# Patient Record
Sex: Female | Born: 1985 | Hispanic: Yes | Smoking: Never smoker
Health system: Southern US, Community
[De-identification: ages and names within clinical notes are randomized; demographics above are authoritative.]

## PROBLEM LIST (undated history)

## (undated) DIAGNOSIS — T7840XA Allergy, unspecified, initial encounter: Secondary | ICD-10-CM

## (undated) DIAGNOSIS — Z860100 Personal history of colon polyps, unspecified: Secondary | ICD-10-CM

## (undated) DIAGNOSIS — K219 Gastro-esophageal reflux disease without esophagitis: Secondary | ICD-10-CM

## (undated) DIAGNOSIS — K317 Polyp of stomach and duodenum: Secondary | ICD-10-CM

## (undated) DIAGNOSIS — K2970 Gastritis, unspecified, without bleeding: Secondary | ICD-10-CM

## (undated) DIAGNOSIS — G43909 Migraine, unspecified, not intractable, without status migrainosus: Secondary | ICD-10-CM

## (undated) DIAGNOSIS — E162 Hypoglycemia, unspecified: Secondary | ICD-10-CM

## (undated) DIAGNOSIS — K635 Polyp of colon: Secondary | ICD-10-CM

## (undated) HISTORY — DX: Polyp of stomach and duodenum: K31.7

## (undated) HISTORY — DX: Personal history of colon polyps, unspecified: Z86.0100

## (undated) HISTORY — PX: OTHER SURGICAL HISTORY: SHX169

## (undated) HISTORY — DX: Polyp of colon: K63.5

## (undated) HISTORY — DX: Hypoglycemia, unspecified: E16.2

## (undated) HISTORY — DX: Personal history of colonic polyps: Z86.010

## (undated) HISTORY — DX: Migraine, unspecified, not intractable, without status migrainosus: G43.909

## (undated) HISTORY — DX: Allergy, unspecified, initial encounter: T78.40XA

## (undated) HISTORY — PX: COLONOSCOPY: SHX174

## (undated) HISTORY — DX: Gastro-esophageal reflux disease without esophagitis: K21.9

---

## 2015-03-15 ENCOUNTER — Other Ambulatory Visit (HOSPITAL_COMMUNITY)
Admission: RE | Admit: 2015-03-15 | Discharge: 2015-03-15 | Disposition: A | Discharge status: Home / Self Care | Payer: Managed Care, Other (non HMO) | Source: Ambulatory Visit | Attending: Gynecology | Admitting: Gynecology

## 2015-03-15 ENCOUNTER — Encounter: Payer: Self-pay | Admitting: Women's Health

## 2015-03-15 ENCOUNTER — Ambulatory Visit (INDEPENDENT_AMBULATORY_CARE_PROVIDER_SITE_OTHER): Payer: Managed Care, Other (non HMO) | Admitting: Women's Health

## 2015-03-15 VITALS — BP 122/80 | Ht 65.0 in | Wt 126.0 lb

## 2015-03-15 DIAGNOSIS — B373 Candidiasis of vulva and vagina: Secondary | ICD-10-CM

## 2015-03-15 DIAGNOSIS — Z01419 Encounter for gynecological examination (general) (routine) without abnormal findings: Secondary | ICD-10-CM | POA: Insufficient documentation

## 2015-03-15 DIAGNOSIS — B3731 Acute candidiasis of vulva and vagina: Secondary | ICD-10-CM

## 2015-03-15 DIAGNOSIS — N898 Other specified noninflammatory disorders of vagina: Secondary | ICD-10-CM | POA: Diagnosis not present

## 2015-03-15 LAB — CBC WITH DIFFERENTIAL/PLATELET
BASOS PCT: 0 % (ref 0–1)
Basophils Absolute: 0 10*3/uL (ref 0.0–0.1)
Eosinophils Absolute: 0.2 10*3/uL (ref 0.0–0.7)
Eosinophils Relative: 2 % (ref 0–5)
HEMATOCRIT: 37.1 % (ref 36.0–46.0)
Hemoglobin: 12.7 g/dL (ref 12.0–15.0)
Lymphocytes Relative: 25 % (ref 12–46)
Lymphs Abs: 2 10*3/uL (ref 0.7–4.0)
MCH: 31.3 pg (ref 26.0–34.0)
MCHC: 34.2 g/dL (ref 30.0–36.0)
MCV: 91.4 fL (ref 78.0–100.0)
MONOS PCT: 6 % (ref 3–12)
MPV: 12.3 fL (ref 8.6–12.4)
Monocytes Absolute: 0.5 10*3/uL (ref 0.1–1.0)
NEUTROS PCT: 67 % (ref 43–77)
Neutro Abs: 5.4 10*3/uL (ref 1.7–7.7)
PLATELETS: 223 10*3/uL (ref 150–400)
RBC: 4.06 MIL/uL (ref 3.87–5.11)
RDW: 13 % (ref 11.5–15.5)
WBC: 8 10*3/uL (ref 4.0–10.5)

## 2015-03-15 LAB — GLUCOSE, RANDOM: GLUCOSE: 79 mg/dL (ref 70–99)

## 2015-03-15 LAB — WET PREP FOR TRICH, YEAST, CLUE
Clue Cells Wet Prep HPF POC: NONE SEEN
Trich, Wet Prep: NONE SEEN

## 2015-03-15 MED ORDER — FLUCONAZOLE 150 MG PO TABS: 150.0000 mg | ORAL_TABLET | Freq: Once | ORAL | Status: DC

## 2015-03-15 NOTE — Progress Notes (Signed)
Teresa Hurst September 13, 1985 [id number]    History:    Presents for annual exam.   Regular monthly cycle using no contraception pregnancy okay. Reports normal Pap history. Husband interpreted.  Past medical history, past surgical history, family history and social history were all reviewed and documented in the EPIC chart.  [place]. Has 1 son. Both parents diabetes.  ROS:  A ROS was performed and pertinent positives and negatives are included.  Exam:  Filed Vitals:   03/15/15 1426  BP: 122/80    General appearance:  Normal Thyroid:  Symmetrical, normal in size, without palpable masses or nodularity. Respiratory  Auscultation:  Clear without wheezing or rhonchi Cardiovascular  Auscultation:  Regular rate, without rubs, murmurs or gallops  Edema/varicosities:  Not grossly evident Abdominal  Soft,nontender, without masses, guarding or rebound.  Liver/spleen:  No organomegaly noted  Hernia:  None appreciated  Skin  Inspection:  Grossly normal   Breasts: Examined lying and sitting.     Right: Without masses, retractions, discharge or axillary adenopathy.     Left: Without masses, retractions, discharge or axillary adenopathy. Gentitourinary   Inguinal/mons:  Normal without inguinal adenopathy  External genitalia:  Normal  BUS/Urethra/Skene's glands:  Normal  Vagina:   Moderate amount of a white discharge wet prep positive for yeast  Cervix:  Normal  Uterus: normal in size, shape and contour.  Midline and mobile  Adnexa/parametria:     Rt: Without masses or tenderness.   Lt: Without masses or tenderness.  Anus and perineum: Normal  Digital rectal exam: Normal sphincter tone without palpated masses or tenderness  Assessment/Plan:  29 y.o.  MHF G2 P1 for annual exam with complaint of vaginal discharge   monthly 7 day cycle/no contraception/pregnancy okay  yeast vaginitis   Plan: Diflucan 150 by mouth 1 dose prescription, proper use given and reviewed yeast  prevention. SBE's, continue regular exercise, calcium rich diet, MVI daily encouraged. Return to office with missed cycle. Aware we no longer deliver. CBC, glucose, UA, Pap.   Harrington ChallengerYOUNG,NANCY J St Peters HospitalWHNP, 3:04 PM 03/15/2015

## 2015-03-15 NOTE — Patient Instructions (Signed)
Clarinda (Health Maintenance) Adoptar un estilo de vida saludable y recibir atencin preventiva pueden ser de suma utilidad para promover la salud y Musician. Hable con el mdico para saber cul es el esquema de exmenes peridicos adecuado para usted. Esta es una buena oportunidad para Teacher, adult education peridicamente al mdico sobre cmo prevenir enfermedades y [place] sano. Entre cada control mdico, hay muchas cosas que puede hacer por s solo. Los expertos han investigado mucho acerca de los cambios en el estilo de vida y las medidas preventivas que muy probablemente preserven su salud. Consulte al mdico para obtener ms informacin. EL PESO Y LA DIETA  Consuma una dieta saludable.  Incluya abundante cantidad de verduras, frutas, productos lcteos descremados y protenas magras.  No coma muchos alimentos con alto contenido de grasas slidas, azcares agregados o sal.  Realice actividad fsica con regularidad. Esta es una de las cosas ms importantes que puede hacer por su salud.  La [employer] de las personas adultas deben hacer actividad fsica durante por lo menos 140mnutos semanales. El ejercicio debe aumentar la frecuencia cardaca y hNature conservation officersudar (ejercicio de intensidad moderada).  Adems, casi todos los adultos deben hacer ejercicios de fortalecimiento al mToysRusveces por semana como complemento del ejercicio de iDante Mantenga un peso saludable.  El ndice de masa corporal ([facility] es una medida que puede usarse para identificar posibles problemas relacionados con el peso. Este ofrece un clculo estimativo de la gAir traffic controlleren funcin del peso y lAgricultural consultant El mdico puede determinar su IEncino Outpatient Surgery Center LLCy ayudarlo a aScience writery mTheatre managerun peso saludable.  Para las mujeres mayores de 20aos:  Un ISurgicare Surgical Associates Of Mahwah LLCmenor de 18,5 se considera bajo peso.  Un IBaptist Health Medical Center - North Little Rockentre 18,5 y 24,9 es normal.  Un ITerrell State Hospitalentre 25 y 29,9 es sobrepeso.  Un IMC de 30 o ms se considera  obesidad. Controlar los niveles de colesterol y lpidos en la sangre  Debe comenzar a hacerse anlisis de sangre para controlar los nAscutneyde lpidos y colesterol a partir de lSed[place] y repetir estos estudios cada 5aos.  Tal vez deba someterse a controles de los niveles de colesterol con ms frecuencia si:  Tiene los niveles de lpidos o colesterol elevados.  Es mayor de 50aos.  Tiene un riesgo alto de tener enfermedades cardacas. DETECCIN DE CNCER  Cncer de pulmn  Se recomienda realizar exmenes de deteccin de cncer de pulmn a las personas adultas que tienen entre 581y 80aos, y corren riesgo de tBest boycncer de pulmn debido a sus antecedentes de tabaquismo.  Se recomienda realizar una tomografa computarizada anual de baja dosis de los pulmones a las personas que:  Siguen fumando.  Hayan dejado de fumar en los ltimos 15aos.  Hayan fumado un paquete diario durante 30aos. El ndice ao-paquete equivale a fumar, en promedio, un paquete de cigarrillos diario durante 1ao.  Debe seguir realizndose estudios de deteccin anual hasta que hayan pasado 15aos desde que dej de fumar.  Estos estudios deben suspenderse si tiene un problema de salud que le impedira recibir tratamiento para eScience writerde pulmn. Cncer de mama  Ponga en prctica la "autoconciencia de las mamas". Esto significa reconocer la apariencia normal de las mamas y cSt. [place]  Adems implica realizarse autoexmenes peridicos de lJohnson & Johnson Informe al mdico si hay algn cambio, sin importar cun pequeo sea.  Si tiene entre 20 y 30aos, un mdico debe hacerle un examen clnico de las mamas cada 1 a 316aoscomo parte del  examen habitual de salud.  Si es mayor de 40aos, debe Electrical engineer un examen clnico de las Microsoft. Tambin debe considerar la posibilidad de realizarse una radiografa de las mamas ([place]) todos los [place].  Si tiene antecedentes familiares de cncer de  mama, hable con el mdico para saber si debe someterse a un estudio gentico.  Si tiene un riesgo alto de Animal nutritionist de mama, hable con el mdico para saber si debe hacerse una resonancia magntica y [employer].  Se recomienda una evaluacin del gen del cncer de mama (BRCA) a las mujeres que tengan familiares con tumores malignos relacionados con el BRCA. Los tumores malignos relacionados con elBRCA incluyen:  [employer].  Los [employer].  Los tumores malignos del peritoneo.  Los resultados de la evaluacin determinarn la necesidad de asesoramiento gentico y de [place] de BRCA1 y BRCA2. Cncer de cuello uterino Ya no se recomiendan los exmenes plvicos de rutina para la deteccin del cncer de cuello uterino en las mujeres que no estn embarazadas que son consideradas sujetos de bajo riesgo de Best boy cncer de los rganos de la pelvis (ovarios, tero y vagina) y que no tienen sntomas. Tal vez sea necesario realizar un examen plvico si tiene sntomas, incluidos aquellos que estn asociados con infecciones en la pelvis. Pregntele al mdico si un examen plvico de deteccin es adecuado para usted.   El Papanicolau es la prueba de deteccin del cncer de cuello uterino para las mujeres que podran Engineer, production.  Si le han realizado una histerectoma por un problema que no era cncer u otra enfermedad que podra causar cncer, ya no necesitar realizarse pruebas de Papanicolaou.  Si es mayor de 39aos y los resultados de las pruebas de Papanicolaou han sido normales durante los ltimos 10aos, ya no es necesario que se realice estos estudios.  Si ha recibido un tratamiento para el cncer de cuello uterino o para una enfermedad que podra causar cncer, necesitar realizarse una prueba de Papanicolaou y controles durante al menos 26 aos de concluido el [place].  Si ya no se realiza pruebas de Papanicolaou, debe evaluar sus factores de riesgo si estos  se modifican (por ejemplo, tiene un nuevo compaero sexual). Esta situacin puede influir en la necesidad de que se someta nuevamente a estudios de deteccin.  Algunas mujeres sufren problemas mdicos que aumentan la probabilidad de tener cncer de cuello uterino. En este caso, el mdico podr indicarle que se someta a exmenes de deteccin y pruebas de Papanicolaou con ms frecuencia.  La prueba del virus del Engineer, technical sales (VPH) es un estudio adicional que puede usarse para la deteccin del cncer de cuello uterino. Esta prueba busca la presencia del virus que puede causar cambios celulares en el cuello del tero. Las clulas que se recolectan durante la prueba de Papanicolaou pueden usarse para el VPH.  La prueba del VPH puede usarse para examinar a las Cendant Corporation de [id number]. Someterse a International aid/development worker del VPH puede prolongar el [employer] las pruebas de Papanicolaou normales de tres a Product manager.  Adems, se debe realizar la prueba del VPH para evaluar a las mujeres de cualquier edad cuyos resultados del Papanicolau no sean claros.  Despus de los 30aos, las mujeres deben realizarse pruebas del VPH con la misma frecuencia que las pruebas de Papanicolau. Cncer colorrectal  Es posible detectar este tipo de cncer y, a menudo, es posible prevenirlo.  Generalmente, los estudios de deteccin de  rutina del cncer colorrectal empiezan a hacerse a Proofreader de los 90aos y [employer] [phone].  El mdico puede recomendar que se los haga antes, si tiene factores de riesgo de cncer de colon.  Adems, el mdico puede recomendar que use un kit de prueba casera para hallar sangre oculta en la materia fecal.  Es posible que se use una pequea cmara en el extremo de un tubo para examinar directamente el colon (sigmoidoscopa o colonoscopa), con el fin de Hydrographic surveyor las formas ms incipientes de Surveyor, minerals.  Generalmente, los estudios de deteccin de rutina se Insurance underwriter  a Proofreader de [place].  El examen directo del colon debe repetirse cada 5 a 10aos hasta cumplir 75aos. Sin embargo, tal vez deba someterse a la prueba de deteccin con ms frecuencia si se encuentran formas incipientes de plipos precancerosos o pequeos tumores. Cncer de piel  Revsese la piel peridicamente desde los dedos de los pies hasta la cabeza.  Informe al mdico si aparecen nuevos lunares o si nota cambios en los que ya tiene, especialmente en la forma o el color.  Tambin notifquele si tiene un lunar cuyo tamao es ms grande que el de la goma de un lpiz.  Use siempre pantalla solar. Aplique pantalla solar tantas veces como pueda a lo largo del da.  Protjase usando mangas y pantalones largos, un sombrero de ala ancha y gafas para el sol todo [place], siempre que est al [place]. ENFERMEDADES CARDACAS, DIABETES E HIPERTENSIN ARTERIAL   Debe controlarse la presin arterial al menos cada 1 o 2aos. La hipertensin arterial causa enfermedades cardacas y [place] el riesgo de ictus.  Si tiene entre 64 y 79aos, consulte al mdico si debe tomar aspirina para prevenir ictus.  Hgase anlisis peridicos para la diabetes, que incluyen la toma de [place] de sangre para Freight forwarder nivel de azcar en la sangre mientras est en ayunas.  Si su peso es normal y tiene un riesgo bajo de tener diabetes, hgase este anlisis una vez cada tres aos, despus de los 45aos.  Si tiene sobrepeso y un riesgo alto de sufrir diabetes, considere la posibilidad de [employer] a una edad ms temprana o con ms frecuencia. PREVENCIN DE INFECCIONES  Hepatitis B  Si tiene un riesgo ms alto de tener hepatitisB, debe hacerse anlisis de deteccin de [place] virus. Se considera que tiene un alto riesgo de hepatitis B si:  Naci en un pas donde la hepatitisB es frecuente. Pregntele al mdico qu pases son considerados de Public affairs consultant.  Sus padres nacieron en un pas de alto  riesgo, y usted no recibi la vacuna contra la hepatitis B.  [place].  [place] agujas para inyectarse drogas.  Convive con una persona que tiene hepatitisB.  Tuvo relaciones sexuales con una persona que tiene hepatitisB.  Recibe tratamiento de hemodilisis. Toma ciertos medicamentos para cuadros clnicos tales Civil engineer, contracting, trasplante de Vaginitis monilisica (Monilial Vaginitis) La vaginitis es una inflamacin (irritacin, hinchazn) de la vagina y la vulva. Esta no es una enfermedad de transmisin sexual.  CAUSAS Este tipo de vaginitis lo causa un hongo (candida) que normalmente se encuentra en la vagina. El hongo candida se ha desarrollado hasta el punto de ocasionar problemas en el equilibrio qumico. SNTOMAS  Secrecin vaginal espesa y blanca.  Hinchazn, picazn, enrojecimiento e inflamacin de la vagina y en algunos casos de los labios vaginales (vulva).  Ardor o dolor al [employer].  Dolor en [place]. DIAGNSTICO  Los factores que favorecen la vaginitis moniliasica son:  Kyla Balzarine de virginidad y postmenopusicas.  Embarazo.  Infecciones.  Sentir cansancio, estar enferma o estresada, especialmente si ya ha sufrido este problema en el pasado.  Diabetes Buen control ayudar a disminur la probabilidad.  Pldoras anticonceptivas  Ropa interior [place].  El uso de espumas de bao, aerosoles femeninos duchas vaginales o tampones con desodorante.  Algunos antibiticos (medicamentos que destruyen grmenes).  Si contrae alguna enfermedad puede sufrir recurrencias espordicas. [place] profesional que lo asiste prescribir medicamentos.  Hay diferentes tipos de cremas y supositorios vaginales que tratan especficamente la vaginitis monilisica. Para infecciones por hongos recurrentes, utilice un supositorio o crema en la vagina dos veces por semana, o segn se le indique.  Tambin podrn utilizarse cremas con corticoides o anti monilisicas  para la picazn o la irritacin de la vulva. Consulte con el profesional que la asiste.  Si la crema no da resultado, podr aplicarse en la vagina una solucin con azul de metileno.  El consumo de yogur puede prevenir este tipo de vaginitis. INSTRUCCIONES Manata todos los medicamentos tal como se le indic.  No mantenga relaciones sexuales hasta que el tratamiento se haya completado, o segn las indicaciones del profesional que la asiste.  Tome baos de asiento tibios.  No se aplique duchas vaginales.  No utilice tampones, especialmente los perfumados.  Use ropa interior de algodn  [employer]user[employer]ch pantalones ajustados y las medias tipo panty.  Comunique a sus compaeros sexuales que sufre una infeccin por hongos. Ellos deben concurrir para un control mdico si tienen sntomas como una urticaria leve o picazn.  Sus compaeros sexuales deben tratarse tambin si la infeccin es difcil de Radiographer, therapeutic.  Practique el sexo seguro - use condones  Algunos medicamentos vaginales ocasionan fallas en los condones de ltex. Los medicamentos vaginales que pueden daar los condones son:  Building services engineer cleocina  Butoconazole (Femstat)  Terconazole (Terazol) supositorios vaginales  Miconazole (Monistat) (es un medicamento de venta libre) SOLICITE ATENCIN MDICA SI:  Waldron Session tiene una temperatura oral de ms de 38,9 C (102 F).  Si la infeccin empeora luego de 2 das de tratamiento.  Si la infeccin no mejora luego de 3 das de tratamiento.  Aparecen ampollas en o alrededor de la vagina.  Si aparece una hemorragia vaginal y no es el momento del perodo.  Siente dolor al [employer].  Presenta problemas intestinales.  Tiene dolor durante las [employer]. Document Released: 05/23/2005 Document Revised: 11/05/2011 [facility] Patient Information 2015 [place]. This information is not intended to replace advice given to you by your health care provider. Make  sure you discuss any questions you have with your health care provider.

## 2015-03-15 NOTE — Addendum Note (Signed)
Addended by: Aura CampsWEBB, Zeke Aker L on: 03/15/2015 03:15 PM   Modules accepted: Orders

## 2015-03-16 LAB — URINALYSIS W MICROSCOPIC + REFLEX CULTURE
Bilirubin Urine: NEGATIVE
CASTS: NONE SEEN
Crystals: NONE SEEN
Glucose, UA: NEGATIVE mg/dL
Hgb urine dipstick: NEGATIVE
KETONES UR: NEGATIVE mg/dL
Leukocytes, UA: NEGATIVE
Nitrite: NEGATIVE
PH: 6.5 (ref 5.0–8.0)
Protein, ur: NEGATIVE mg/dL
Specific Gravity, Urine: 1.027 (ref 1.005–1.030)
Urobilinogen, UA: 0.2 mg/dL (ref 0.0–1.0)

## 2015-03-17 LAB — CYTOLOGY - PAP

## 2015-03-17 LAB — URINE CULTURE
Colony Count: NO GROWTH
Organism ID, Bacteria: NO GROWTH

## 2015-06-04 ENCOUNTER — Ambulatory Visit (INDEPENDENT_AMBULATORY_CARE_PROVIDER_SITE_OTHER): Payer: Managed Care, Other (non HMO) | Admitting: Family Medicine

## 2015-06-04 VITALS — BP 106/56 | HR 81 | Temp 98.6°F | Resp 16 | Ht 64.5 in | Wt 129.0 lb

## 2015-06-04 DIAGNOSIS — R1084 Generalized abdominal pain: Secondary | ICD-10-CM | POA: Diagnosis not present

## 2015-06-04 DIAGNOSIS — J301 Allergic rhinitis due to pollen: Secondary | ICD-10-CM

## 2015-06-04 DIAGNOSIS — Z86010 Personal history of colonic polyps: Secondary | ICD-10-CM

## 2015-06-04 DIAGNOSIS — K625 Hemorrhage of anus and rectum: Secondary | ICD-10-CM | POA: Diagnosis not present

## 2015-06-04 DIAGNOSIS — K2970 Gastritis, unspecified, without bleeding: Secondary | ICD-10-CM | POA: Diagnosis not present

## 2015-06-04 DIAGNOSIS — Z23 Encounter for immunization: Secondary | ICD-10-CM | POA: Diagnosis not present

## 2015-06-04 LAB — COMPREHENSIVE METABOLIC PANEL
ALK PHOS: 44 U/L (ref 33–115)
ALT: 16 U/L (ref 6–29)
AST: 16 U/L (ref 10–30)
Albumin: 4.3 g/dL (ref 3.6–5.1)
BILIRUBIN TOTAL: 0.5 mg/dL (ref 0.2–1.2)
BUN: 10 mg/dL (ref 7–25)
CO2: 24 mmol/L (ref 20–31)
CREATININE: 0.66 mg/dL (ref 0.50–1.10)
Calcium: 9.7 mg/dL (ref 8.6–10.2)
Chloride: 103 mmol/L (ref 98–110)
GLUCOSE: 92 mg/dL (ref 65–99)
POTASSIUM: 4 mmol/L (ref 3.5–5.3)
SODIUM: 136 mmol/L (ref 135–146)
TOTAL PROTEIN: 7.1 g/dL (ref 6.1–8.1)

## 2015-06-04 LAB — POCT URINALYSIS DIP (MANUAL ENTRY)
BILIRUBIN UA: NEGATIVE
BILIRUBIN UA: NEGATIVE
Glucose, UA: NEGATIVE
Leukocytes, UA: NEGATIVE
Nitrite, UA: NEGATIVE
PH UA: 7.5
PROTEIN UA: NEGATIVE
RBC UA: NEGATIVE
SPEC GRAV UA: 1.015
Urobilinogen, UA: 0.2

## 2015-06-04 LAB — POCT SEDIMENTATION RATE: POCT SED RATE: 9 mm/h (ref 0–22)

## 2015-06-04 LAB — IFOBT (OCCULT BLOOD): IMMUNOLOGICAL FECAL OCCULT BLOOD TEST: NEGATIVE

## 2015-06-04 LAB — POCT CBC
Granulocyte percent: 64.2 %G (ref 37–80)
HEMATOCRIT: 39.5 % (ref 37.7–47.9)
Hemoglobin: 12.1 g/dL — AB (ref 12.2–16.2)
LYMPH, POC: 2.2 (ref 0.6–3.4)
MCH, POC: 27.9 pg (ref 27–31.2)
MCHC: 30.8 g/dL — AB (ref 31.8–35.4)
MCV: 90.7 fL (ref 80–97)
MID (CBC): 0.6 (ref 0–0.9)
MPV: 10 fL (ref 0–99.8)
POC Granulocyte: 4.9 (ref 2–6.9)
POC LYMPH %: 28.3 % (ref 10–50)
POC MID %: 7.5 %M (ref 0–12)
Platelet Count, POC: 216 10*3/uL (ref 142–424)
RBC: 4.35 M/uL (ref 4.04–5.48)
RDW, POC: 12.3 %
WBC: 7.6 10*3/uL (ref 4.6–10.2)

## 2015-06-04 LAB — POCT URINE PREGNANCY: PREG TEST UR: NEGATIVE

## 2015-06-04 NOTE — Progress Notes (Addendum)
Subjective:  This chart was scribed for Nilda Simmer, MD by Andrew Au, ED Scribe. This patient was seen in room 9 and the patient's care was started at 1:13 PM   Patient ID: Teresa Hurst, female    DOB: [birth year], 29 y.o.   MRN: [id number]  HPI   Chief Complaint  Patient presents with  . Rectal Bleeding    Onset 2 days ago  . Abdominal Pain    Onset 1 month off and on   HPI Comments: Teresa Hurst is a 29 y.o. female who presents to the [facility] complaining of intermittent rectal bleeding that began 2 months ago. Pt has noticed a small amount of pinkish/red blood with mucous occasionally with wiping after BM's. She has a BM everyday. She's also had burning lower abdominal pain and cramping after having BM's.  She has hx of polyps in colon that she had surgically [place] in [place] 10 years ago. She is supposed to have colonoscopy annually but states its been 3 years since having one, due to insurance reasons. She's had a total of 3 colonoscopies in the past.  Pt has lived in [place] for 1 year. She has family hx of colon cancer including her cousin and uncle. She has hx of gastritis and takes zantac once a day. She denies hx of Crohn's or ulcerative colitis in family or personally. She denies constipation, diarrhea dizziness, weight loss and appetite change.  She also c/o a HA for 1 month. She reports associated visual disturbance and swollen lymph nodes.  P has hx of allergies and has sinus congestion with drainage recently.  Family hx- Pt mother is 79 with hx of HTN and DM. Her father is DM. She has 1 sister and 2 brothers with hx of allergies.   Social Hx- Pt lives with her boyfriend and her son who is almost 2. She is originally from [place] She denies chance of pregnancy. She is not a smoker, drinks alcohol on the weekend occasionally, but no elicit drug use. Pt does not work. Occasionally exercises.  She is allergic to Toradol.   Past  Medical History  Diagnosis Date  . GERD (gastroesophageal reflux disease)   . History of colon polyps   . Allergy    Allergies  Allergen Reactions  . Toradol [Ketorolac Tromethamine]    Prior to Admission medications   Medication Sig Start Date End Date Taking? Authorizing Provider  ibuprofen (ADVIL,MOTRIN) 200 MG tablet Take 200 mg by mouth every 6 (six) hours as needed.   Yes Historical Provider, MD  ranitidine (ZANTAC) 150 MG tablet Take 150 mg by mouth once.   Yes Historical Provider, MD   Social History   Social History  . Marital Status: Significant Other    Spouse Name: N/A  . Number of Children: N/A  . Years of Education: N/A   Occupational History  . Not on file.   Social History Main Topics  . Smoking status: Never Smoker   . Smokeless tobacco: Not on file  . Alcohol Use: 0.6 oz/week    1 Standard drinks or equivalent per week  . Drug Use: No  . Sexual Activity: Yes     Comment: INTERCOURSE AGE 60, SEXUAL PARTNRES LESS THAN 5   Other Topics Concern  . Not on file   Social History Narrative   Marital status:  Single; dating seriously.  From [place] moved to [place] in 2015.      Children:  87 son (16 years old)      Lives: with boyfriend, 1 son.      Employment: unemployed.      Tobacco; none      Alcohol: weekends      Drugs: none      Exercise: yes   Family History  Problem Relation Age of Onset  . Hypertension Mother   . Diabetes Mother   . Diabetes Father     Review of Systems  Constitutional: Negative for fever, chills, diaphoresis, activity change, appetite change, fatigue and unexpected weight change.  HENT: Positive for congestion, rhinorrhea and sinus pressure. Negative for ear pain, sore throat and trouble swallowing.   Eyes: Positive for visual disturbance.  Respiratory: Negative for cough and shortness of breath.   Cardiovascular: Negative for chest pain.  Gastrointestinal: Positive for abdominal pain and anal bleeding. Negative for  nausea, vomiting, diarrhea, constipation, blood in stool and rectal pain.  Genitourinary: Negative for dysuria, urgency and hematuria.  Neurological: Positive for headaches. Negative for dizziness, tremors, seizures, syncope, facial asymmetry, speech difficulty, weakness, light-headedness and numbness.  Hematological: Positive for adenopathy.  Psychiatric/Behavioral: Negative for dysphoric mood.       Objective:   Physical Exam  Constitutional: She is oriented to person, place, and time. She appears well-developed and well-nourished. No distress.  HENT:  Head: Normocephalic and atraumatic.  Right Ear: External ear normal.  Left Ear: External ear normal.  Nose: Nose normal.  Mouth/Throat: Oropharynx is clear and moist.  Eyes: Conjunctivae and EOM are normal. Pupils are equal, round, and reactive to light.  Neck: Normal range of motion. Neck supple. No thyromegaly present.  Cardiovascular: Normal rate, regular rhythm and normal heart sounds.   Pulmonary/Chest: Effort normal and breath sounds normal. No respiratory distress. She has no wheezes. She has no rales.  Abdominal: Soft. Bowel sounds are normal. She exhibits no distension and no mass. There is tenderness in the right lower quadrant and left lower quadrant. There is no rebound and no guarding.  Genitourinary: Rectum normal. Rectal exam shows no external hemorrhoid, no fissure, no mass and no tenderness.  Musculoskeletal: Normal range of motion.  Lymphadenopathy:    She has no cervical adenopathy.  Neurological: She is alert and oriented to person, place, and time.  Skin: Skin is warm and dry. She is not diaphoretic.  Psychiatric: She has a normal mood and affect. Her behavior is normal.  Nursing note and vitals reviewed.   Filed Vitals:   06/04/15 1306  BP: 106/56  Pulse: 81  Temp: 98.6 F (37 C)  TempSrc: Oral  Resp: 16  Height: 5' 4.5" (1.638 m)  Weight: 129 lb (58.514 kg)  SpO2: 97%    Results for orders placed or  performed in visit on 06/04/15  IFOBT POC (occult bld, rslt in office)  Result Value Ref Range   IFOBT Negative   POCT CBC  Result Value Ref Range   WBC 7.6 4.6 - 10.2 K/uL   Lymph, poc 2.2 0.6 - 3.4   POC LYMPH PERCENT 28.3 10 - 50 %L   MID (cbc) 0.6 0 - 0.9   POC MID % 7.5 0 - 12 %M   POC Granulocyte 4.9 2 - 6.9   Granulocyte percent 64.2 37 - 80 %G   RBC 4.35 4.04 - 5.48 M/uL   Hemoglobin 12.1 (A) 12.2 - 16.2 g/dL   HCT, POC 78.2 95.6 - 47.9 %   MCV 90.7 80 - 97 fL   MCH, POC  27.9 27 - 31.2 pg   MCHC 30.8 (A) 31.8 - 35.4 g/dL   RDW, POC 21.3 %   Platelet Count, POC 216 142 - 424 K/uL   MPV 10.0 0 - 99.8 fL  POCT SEDIMENTATION RATE  Result Value Ref Range   POCT SED RATE 9 0 - 22 mm/hr  POCT urine pregnancy  Result Value Ref Range   Preg Test, Ur Negative Negative  POCT urinalysis dipstick  Result Value Ref Range   Color, UA yellow yellow   Clarity, UA clear clear   Glucose, UA negative negative   Bilirubin, UA negative negative   Ketones, POC UA negative negative   Spec Grav, UA 1.015    Blood, UA negative negative   pH, UA 7.5    Protein Ur, POC negative negative   Urobilinogen, UA 0.2    Nitrite, UA Negative Negative   Leukocytes, UA Negative Negative    Assessment & Plan:   1. Rectal bleeding   2. Generalized abdominal pain   3. History of colonic polyps   4. Gastritis   5. Allergic rhinitis due to pollen   6. Need for prophylactic vaccination and inoculation against influenza     1. Rectal bleeding:  New.  History of colon polyps and s/p three colonoscopies in the past; refer to GI. 2.  Generalized abdominal pain:  New.  Associated with rectal bleeding and history of colon polyps. Refer to GI.  3.  History of colon polyps: with previous three colonoscopies in the past; question of familial polyposis; family history of colon cancer; refer to GI. 4.  Gastritis: stable; continue Zantac daily.  Recommend avoiding NSAIDs. 5.  Allergic Rhinitis: uncontrolled;  recommend Zyrtec/Claritin daily and Flonase daily. 6.  S/p flu vaccine.   Orders Placed This Encounter  Procedures  . Flu Vaccine QUAD 36+ mos IM  . Comprehensive metabolic panel  . Ambulatory referral to Gastroenterology    Referral Priority:  Routine    Referral Type:  Consultation    Referral Reason:  Specialty Services Required    Referred to Provider:  Iva Boop, MD    Number of Visits Requested:  1  . IFOBT POC (occult bld, rslt in office)  . POCT CBC  . POCT SEDIMENTATION RATE  . POCT urine pregnancy  . POCT urinalysis dipstick    Meds ordered this encounter  Medications  . ranitidine (ZANTAC) 150 MG tablet    Sig: Take 150 mg by mouth once.  Marland Kitchen ibuprofen (ADVIL,MOTRIN) 200 MG tablet    Sig: Take 200 mg by mouth every 6 (six) hours as needed.  . loratadine (CLARITIN) 10 MG tablet    Sig: Take 1 tablet (10 mg total) by mouth daily.    Dispense:  30 tablet    Refill:  11  . fluticasone (FLONASE) 50 MCG/ACT nasal spray    Sig: Place 2 sprays into both nostrils daily.    Dispense:  16 g    Refill:  11    I personally performed the services described in this documentation, which was scribed in my presence. The recorded information has been reviewed and considered.  Kristi Paulita Fujita, M.D. [facility] [address] [place], [place]  [address] [phone] phone [phone] fax

## 2015-06-05 ENCOUNTER — Encounter: Payer: Self-pay | Admitting: Family Medicine

## 2015-06-05 MED ORDER — FLUTICASONE PROPIONATE 50 MCG/ACT NA SUSP: 2.0000 | Freq: Every day | NASAL | Status: DC

## 2015-06-05 MED ORDER — LORATADINE 10 MG PO TABS: 10.0000 mg | ORAL_TABLET | Freq: Every day | ORAL | Status: DC

## 2015-09-09 ENCOUNTER — Ambulatory Visit: Payer: Managed Care, Other (non HMO) | Admitting: Family Medicine

## 2015-09-19 ENCOUNTER — Ambulatory Visit (INDEPENDENT_AMBULATORY_CARE_PROVIDER_SITE_OTHER): Payer: Managed Care, Other (non HMO) | Admitting: Family Medicine

## 2015-09-19 ENCOUNTER — Encounter: Payer: Self-pay | Admitting: Family Medicine

## 2015-09-19 VITALS — BP 98/61 | HR 72 | Temp 97.9°F | Resp 16 | Ht 64.25 in | Wt 130.6 lb

## 2015-09-19 DIAGNOSIS — Z8719 Personal history of other diseases of the digestive system: Secondary | ICD-10-CM | POA: Diagnosis not present

## 2015-09-19 DIAGNOSIS — K625 Hemorrhage of anus and rectum: Secondary | ICD-10-CM

## 2015-09-19 DIAGNOSIS — R1013 Epigastric pain: Secondary | ICD-10-CM | POA: Diagnosis not present

## 2015-09-19 DIAGNOSIS — Z23 Encounter for immunization: Secondary | ICD-10-CM

## 2015-09-19 LAB — POCT URINE PREGNANCY: Preg Test, Ur: NEGATIVE

## 2015-09-19 LAB — COMPREHENSIVE METABOLIC PANEL
ALT: 11 U/L (ref 6–29)
AST: 14 U/L (ref 10–30)
Albumin: 4.5 g/dL (ref 3.6–5.1)
Alkaline Phosphatase: 45 U/L (ref 33–115)
BUN: 14 mg/dL (ref 7–25)
CHLORIDE: 105 mmol/L (ref 98–110)
CO2: 23 mmol/L (ref 20–31)
CREATININE: 0.85 mg/dL (ref 0.50–1.10)
Calcium: 9.4 mg/dL (ref 8.6–10.2)
GLUCOSE: 95 mg/dL (ref 65–99)
POTASSIUM: 3.8 mmol/L (ref 3.5–5.3)
SODIUM: 138 mmol/L (ref 135–146)
Total Bilirubin: 0.5 mg/dL (ref 0.2–1.2)
Total Protein: 7.1 g/dL (ref 6.1–8.1)

## 2015-09-19 LAB — HEMOCCULT GUIAC POC 1CARD (OFFICE): Fecal Occult Blood, POC: NEGATIVE

## 2015-09-19 LAB — POCT CBC
GRANULOCYTE PERCENT: 65.8 % (ref 37–80)
HEMATOCRIT: 37.5 % — AB (ref 37.7–47.9)
HEMOGLOBIN: 12.9 g/dL (ref 12.2–16.2)
LYMPH, POC: 2.2 (ref 0.6–3.4)
MCH: 31.1 pg (ref 27–31.2)
MCHC: 34.5 g/dL (ref 31.8–35.4)
MCV: 90.2 fL (ref 80–97)
MID (cbc): 0.4 (ref 0–0.9)
MPV: 9.9 fL (ref 0–99.8)
POC GRANULOCYTE: 5.1 (ref 2–6.9)
POC LYMPH PERCENT: 28.6 %L (ref 10–50)
POC MID %: 5.6 %M (ref 0–12)
Platelet Count, POC: 203 10*3/uL (ref 142–424)
RBC: 4.16 M/uL (ref 4.04–5.48)
RDW, POC: 11.8 %
WBC: 7.7 10*3/uL (ref 4.6–10.2)

## 2015-09-19 LAB — POCT URINALYSIS DIP (MANUAL ENTRY)
BILIRUBIN UA: NEGATIVE
Glucose, UA: NEGATIVE
Leukocytes, UA: NEGATIVE
Nitrite, UA: NEGATIVE
PH UA: 7.5
Protein Ur, POC: NEGATIVE
RBC UA: NEGATIVE
Spec Grav, UA: 1.02
Urobilinogen, UA: 0.2

## 2015-09-19 MED ORDER — HYDROCORTISONE ACETATE 25 MG RE SUPP: 25.0000 mg | Freq: Two times a day (BID) | RECTAL | Status: DC

## 2015-09-19 MED ORDER — LANSOPRAZOLE 30 MG PO CPDR: 30.0000 mg | DELAYED_RELEASE_CAPSULE | Freq: Every day | ORAL | Status: DC

## 2015-09-19 NOTE — Progress Notes (Signed)
Subjective:    Patient ID: Teresa Hurst, female    DOB: February 07, 1986, 30 y.o.   MRN: [id number]  09/19/2015  Referral; Abdominal Pain; and Nausea   HPI This 30 y.o. female presents for evaluation of "problems with my stomach".  Started having rectal bleeding and burning in stomach and malaise and fatigue.  On menses, symptoms get worse.  With last b.m., with bowel movement cleaned self and blooody.  Afterwards there was a burning sensation and had to lie down due to fatigue.  Onset with acute episode, this week and last week.  Ended menses three days ago. Small rectal bleeding yesterday but not bright red; yesterday had cramping.   Referred to GI in October 2016; cancelled appointment due to travel. Then, husband called to reschedule; pt needs rereferral.  Only a few episodes of rectal bleeding; frequently occurs with menses and must have b.m.  One C-section.  No constipation; no straining.  With eating fatty foods or spicy foods, gets pain in middle of abdomen.  Cousin with colon cancer; another cousin with colitis.  Chronic gastritis.  During childhood, removed gastric polyp.  Also found a bacteria and had to take medication. No recent EGD recently.    Review of Systems  Constitutional: Negative for fever, chills, diaphoresis and fatigue.  Eyes: Negative for visual disturbance.  Respiratory: Negative for cough and shortness of breath.   Cardiovascular: Negative for chest pain, palpitations and leg swelling.  Gastrointestinal: Negative for nausea, vomiting, abdominal pain, diarrhea and constipation.  Endocrine: Negative for cold intolerance, heat intolerance, polydipsia, polyphagia and polyuria.  Neurological: Negative for dizziness, tremors, seizures, syncope, facial asymmetry, speech difficulty, weakness, light-headedness, numbness and headaches.    Past Medical History  Diagnosis Date  . GERD (gastroesophageal reflux disease)   . History of colon polyps   . Allergy    Past  Surgical History  Procedure Laterality Date  . Colon polyps  removed   Allergies  Allergen Reactions  . Toradol [Ketorolac Tromethamine]    Current Outpatient Prescriptions  Medication Sig Dispense Refill  . ibuprofen (ADVIL,MOTRIN) 200 MG tablet Take 200 mg by mouth every 6 (six) hours as needed.    . ranitidine (ZANTAC) 150 MG tablet Take 150 mg by mouth once.    . fluticasone (FLONASE) 50 MCG/ACT nasal spray Place 2 sprays into both nostrils daily. (Patient not taking: Reported on 09/19/2015) 16 g 11  . loratadine (CLARITIN) 10 MG tablet Take 1 tablet (10 mg total) by mouth daily. (Patient not taking: Reported on 09/19/2015) 30 tablet 11   No current facility-administered medications for this visit.   Social History   Social History  . Marital Status: Significant Other    Spouse Name: N/A  . Number of Children: N/A  . Years of Education: N/A   Occupational History  . Not on file.   Social History Main Topics  . Smoking status: Never Smoker   . Smokeless tobacco: Not on file  . Alcohol Use: 0.6 oz/week    1 Standard drinks or equivalent per week  . Drug Use: No  . Sexual Activity: Yes     Comment: INTERCOURSE AGE 21, SEXUAL PARTNRES LESS THAN 5   Other Topics Concern  . Not on file   Social History Narrative   Marital status:  Single; dating seriously.  From [place] moved to [place] in 2015.      Children:  88 son (74 years old)      Lives: with boyfriend, 1 son.  Employment: unemployed.      Tobacco; none      Alcohol: weekends      Drugs: none      Exercise: yes   Family History  Problem Relation Age of Onset  . Hypertension Mother   . Diabetes Mother   . Diabetes Father        Objective:    BP 98/61 mmHg  Pulse 72  Temp(Src) 97.9 F (36.6 C) (Oral)  Resp 16  Ht 5' 4.25" (1.632 m)  Wt 130 lb 9.6 oz (59.24 kg)  BMI 22.24 kg/m2  SpO2 99%  LMP 09/11/2015 Physical Exam  Constitutional: She is oriented to person, place, and time. She appears  well-developed and well-nourished. No distress.  HENT:  Head: Normocephalic and atraumatic.  Right Ear: External ear normal.  Left Ear: External ear normal.  Nose: Nose normal.  Mouth/Throat: Oropharynx is clear and moist.  Eyes: Conjunctivae and EOM are normal. Pupils are equal, round, and reactive to light.  Neck: Normal range of motion. Neck supple. Carotid bruit is not present. No thyromegaly present.  Cardiovascular: Normal rate, regular rhythm, normal heart sounds and intact distal pulses.  Exam reveals no gallop and no friction rub.   No murmur heard. Pulmonary/Chest: Effort normal and breath sounds normal. She has no wheezes. She has no rales.  Abdominal: Soft. Bowel sounds are normal. She exhibits no distension and no mass. There is no tenderness. There is no rebound and no guarding.  Lymphadenopathy:    She has no cervical adenopathy.  Neurological: She is alert and oriented to person, place, and time. No cranial nerve deficit.  Skin: Skin is warm and dry. No rash noted. She is not diaphoretic. No erythema. No pallor.  Psychiatric: She has a normal mood and affect. Her behavior is normal.   Results for orders placed or performed in visit on 06/04/15  Comprehensive metabolic panel  Result Value Ref Range   Sodium 136 135 - 146 mmol/L   Potassium 4.0 3.5 - 5.3 mmol/L   Chloride 103 98 - 110 mmol/L   CO2 24 20 - 31 mmol/L   Glucose, Bld 92 65 - 99 mg/dL   BUN 10 7 - 25 mg/dL   Creat 1.61 0.96 - 0.45 mg/dL   Total Bilirubin 0.5 0.2 - 1.2 mg/dL   Alkaline Phosphatase 44 33 - 115 U/L   AST 16 10 - 30 U/L   ALT 16 6 - 29 U/L   Total Protein 7.1 6.1 - 8.1 g/dL   Albumin 4.3 3.6 - 5.1 g/dL   Calcium 9.7 8.6 - 40.9 mg/dL  IFOBT POC (occult bld, rslt in office)  Result Value Ref Range   IFOBT Negative   POCT CBC  Result Value Ref Range   WBC 7.6 4.6 - 10.2 K/uL   Lymph, poc 2.2 0.6 - 3.4   POC LYMPH PERCENT 28.3 10 - 50 %L   MID (cbc) 0.6 0 - 0.9   POC MID % 7.5 0 - 12 %M     POC Granulocyte 4.9 2 - 6.9   Granulocyte percent 64.2 37 - 80 %G   RBC 4.35 4.04 - 5.48 M/uL   Hemoglobin 12.1 (A) 12.2 - 16.2 g/dL   HCT, POC 81.1 91.4 - 47.9 %   MCV 90.7 80 - 97 fL   MCH, POC 27.9 27 - 31.2 pg   MCHC 30.8 (A) 31.8 - 35.4 g/dL   RDW, POC 78.2 %   Platelet Count, POC 216  142 - 424 K/uL   MPV 10.0 0 - 99.8 fL  POCT SEDIMENTATION RATE  Result Value Ref Range   POCT SED RATE 9 0 - 22 mm/hr  POCT urine pregnancy  Result Value Ref Range   Preg Test, Ur Negative Negative  POCT urinalysis dipstick  Result Value Ref Range   Color, UA yellow yellow   Clarity, UA clear clear   Glucose, UA negative negative   Bilirubin, UA negative negative   Ketones, POC UA negative negative   Spec Grav, UA 1.015    Blood, UA negative negative   pH, UA 7.5    Protein Ur, POC negative negative   Urobilinogen, UA 0.2    Nitrite, UA Negative Negative   Leukocytes, UA Negative Negative       Assessment & Plan:  No diagnosis found.  No orders of the defined types were placed in this encounter.   No orders of the defined types were placed in this encounter.    No Follow-up on file.    Kourtney Terriquez Paulita Fujita, M.D. [facility] [address] [place], [place]  [address] [phone] phone [phone] fax

## 2015-09-19 NOTE — Patient Instructions (Signed)
Gastritis - Adultos °(Gastritis, Adult) ° La gastrittis es la irritación (inflamación) de la membrana interna del estómago. Puede ser una enfermedad de inicio súbito (aguda) o de largo plazo (crónica). Si la gastritis no se trata, puede causar sangrado y úlceras. °CAUSAS  °La gastritis se produce cuando la membrana que tapiza interiormente al estómago se debilita o se daña. Los jugos digestivos del estómago inflaman el revestimiento del estómago debilitado. El revestimiento del estómago puede debilitarse o dañarse por una infección viral o bacteriana. La infección bacteriana más común es la infección por Helicobacter pylori. También puede ser el resultado del consumo excesivo de alcohol, por el uso de ciertos medicamentos o porque hay demasiado ácido en el estómago.  °SÍNTOMAS  °En algunos casos no hay síntomas. Si se presentan síntomas, éstos pueden ser:  °· Dolor o sensación de ardor en la parte superior del abdomen. °· Náuseas. °· Vómitos. °· Sensación molesta de distensión después de comer. °DIAGNÓSTICO  °El médico puede diagnosticar gastritis según los síntomas y el examen físico. Para determinar la causa de la gastritis, el médico podrá:  °· Pedir análisis de sangre o de materia fecal para diagnosticar la presencia de la bacteria H pylori. °· Gastroscopía. Un tubo delgado y flexible (endoscopio) se pasa por el esófago hasta llegar al estómago. El endoscopio tiene una luz y una cámara en el extremo. El médico utilizará el endoscopio para observar el interior del estómago. °· Tomará una muestra de tejido (biopsia) del estómago para examinarlo en el microscopio. °TRATAMIENTO  °Según la causa de la gastritis podrán recetarle: Antibióticos, si la causa es una infección bacteriana, como una infección por H. pylori. Antiácidos o bloqueadores H2, si hay demasiado ácido en el estómago. El médico le aconsejará que deje de tomar aspirina, ibuprofeno u otros antiinflamatorios no esteroides (AINE).  °INSTRUCCIONES PARA EL  CUIDADO EN EL HOGAR  °· Tome sólo medicamentos de venta libre o recetados, según las indicaciones del médico. °· Si le han recetado antibióticos, tómelos según las indicaciones. Tómelos todos, aunque se sienta mejor. °· Debe ingerir gran cantidad de líquido para mantener la orina de tono claro o color amarillo pálido. °· Evite las comidas y bebidas que empeoran los problemas, como: °¨ Bebidas con cafeína o alcohólicas. °¨ Chocolate. °¨ Sabores a menta. °¨ Ajo y cebolla. °¨ Comidas muy condimentadas. °¨ Cítricos como naranjas, limones o limas. °¨ Alimentos que contengan tomate, como salsas, chile y pizza. °¨ Alimentos fritos y grasos. °· Haga comidas pequeñas durante el día en lugar de 3 comidas abundantes. °SOLICITE ATENCIÓN MÉDICA DE INMEDIATO SI:  °· La materia fecal es negra o de color rojo oscuro. °· Vomita sangre de color rojo brillante o material similar a granos de café. °· No puede retener los líquidos. °· El dolor abdominal empeora. °· Tiene fiebre. °· No mejora luego de 1 semana. °· Tiene preguntas o preocupaciones. °ASEGÚRESE DE QUE:  °· Comprende estas instrucciones. °· Controlará su enfermedad. °· Solicitará ayuda de inmediato si no mejora o si empeora. °  °Esta información no tiene como fin reemplazar el consejo del médico. Asegúrese de hacerle al médico cualquier pregunta que tenga. °  °Document Released: 05/23/2005 Document Revised: 05/04/2015 °Elsevier Interactive Patient Education ©2016 Elsevier Inc. ° °

## 2015-09-20 LAB — SEDIMENTATION RATE: Sed Rate: 9 mm/hr (ref 0–20)

## 2015-09-27 ENCOUNTER — Telehealth: Payer: Self-pay

## 2015-09-27 NOTE — Telephone Encounter (Signed)
Dr Katrinka Blazing, PA is needed for lansoprazole. It is never a preferred PPI. I don't see that pt has tried any other PPIs. Can you change it to pantoprazole or omeprazole (the two most likely to be covered)?

## 2015-09-29 MED ORDER — PANTOPRAZOLE SODIUM 40 MG PO TBEC: 40.0000 mg | DELAYED_RELEASE_TABLET | Freq: Every day | ORAL | Status: DC

## 2015-09-29 NOTE — Telephone Encounter (Signed)
Rx for Protonix sent to pharmacy.  Please advise patient.

## 2015-09-29 NOTE — Telephone Encounter (Signed)
Advised husband about new Rx. He agreed to have pt p/up and start. He also had a question about referral to GI because they have not heard anything yet. I explained that it can take a few days to hear sometimes, but that I will send a message to Referral dept to check on it. Referrals...thanks!

## 2015-10-03 ENCOUNTER — Encounter: Payer: Self-pay | Admitting: Family Medicine

## 2015-10-03 NOTE — Telephone Encounter (Signed)
[facility] has called the patient to schedule, but there was no voicemail set up.  They could not leave a message to alert the patient to return their call.

## 2015-10-03 NOTE — Telephone Encounter (Signed)
Called and spoke to husband again. Gave him [facility] ph # and advised him to call them to sch appt. He agreed.

## 2015-10-04 ENCOUNTER — Other Ambulatory Visit: Payer: Self-pay

## 2015-10-04 NOTE — Telephone Encounter (Signed)
PA is also needed for pantoprazole. Completed on covermymeds. Pending.

## 2015-10-06 NOTE — Telephone Encounter (Signed)
PA was denied, and I called ins to find out which PPI is covered. I was advised that they don't cover any PPIs. Checked prices on [place] and they were pretty inexpensive at [employer]. Called husband and advised. He stated that he already paid cash for it and pt is taking.

## 2015-10-10 ENCOUNTER — Encounter: Payer: Self-pay | Admitting: Internal Medicine

## 2015-10-12 ENCOUNTER — Encounter (HOSPITAL_COMMUNITY): Payer: Self-pay | Admitting: Emergency Medicine

## 2015-10-12 ENCOUNTER — Emergency Department (HOSPITAL_COMMUNITY)
Admission: EM | Admit: 2015-10-12 | Discharge: 2015-10-12 | Disposition: A | Discharge status: Home / Self Care | Payer: Managed Care, Other (non HMO) | Attending: Emergency Medicine | Admitting: Emergency Medicine

## 2015-10-12 DIAGNOSIS — K219 Gastro-esophageal reflux disease without esophagitis: Secondary | ICD-10-CM | POA: Diagnosis not present

## 2015-10-12 DIAGNOSIS — R112 Nausea with vomiting, unspecified: Secondary | ICD-10-CM | POA: Diagnosis present

## 2015-10-12 DIAGNOSIS — Z3202 Encounter for pregnancy test, result negative: Secondary | ICD-10-CM | POA: Insufficient documentation

## 2015-10-12 DIAGNOSIS — A084 Viral intestinal infection, unspecified: Secondary | ICD-10-CM

## 2015-10-12 DIAGNOSIS — Z79899 Other long term (current) drug therapy: Secondary | ICD-10-CM | POA: Diagnosis not present

## 2015-10-12 DIAGNOSIS — Z86010 Personal history of colonic polyps: Secondary | ICD-10-CM | POA: Insufficient documentation

## 2015-10-12 HISTORY — DX: Gastritis, unspecified, without bleeding: K29.70

## 2015-10-12 LAB — COMPREHENSIVE METABOLIC PANEL
ALBUMIN: 5.2 g/dL — AB (ref 3.5–5.0)
ALK PHOS: 49 U/L (ref 38–126)
ALT: 17 U/L (ref 14–54)
AST: 22 U/L (ref 15–41)
Anion gap: 8 (ref 5–15)
BILIRUBIN TOTAL: 1.2 mg/dL (ref 0.3–1.2)
BUN: 14 mg/dL (ref 6–20)
CO2: 25 mmol/L (ref 22–32)
CREATININE: 0.69 mg/dL (ref 0.44–1.00)
Calcium: 9.4 mg/dL (ref 8.9–10.3)
Chloride: 108 mmol/L (ref 101–111)
GFR calc Af Amer: >60 mL/min (ref >60)
GLUCOSE: 122 mg/dL — AB (ref 65–99)
Potassium: 3.4 mmol/L — ABNORMAL LOW (ref 3.5–5.1)
Sodium: 141 mmol/L (ref 135–145)
TOTAL PROTEIN: 8.3 g/dL — AB (ref 6.5–8.1)

## 2015-10-12 LAB — LIPASE, BLOOD: Lipase: 26 U/L (ref 11–51)

## 2015-10-12 LAB — CBC
HEMATOCRIT: 41.6 % (ref 36.0–46.0)
Hemoglobin: 14.2 g/dL (ref 12.0–15.0)
MCH: 31.9 pg (ref 26.0–34.0)
MCHC: 34.1 g/dL (ref 30.0–36.0)
MCV: 93.5 fL (ref 78.0–100.0)
PLATELETS: 236 10*3/uL (ref 150–400)
RBC: 4.45 MIL/uL (ref 3.87–5.11)
RDW: 12.5 % (ref 11.5–15.5)
WBC: 14.8 10*3/uL — AB (ref 4.0–10.5)

## 2015-10-12 LAB — URINALYSIS, ROUTINE W REFLEX MICROSCOPIC
BILIRUBIN URINE: NEGATIVE
GLUCOSE, UA: NEGATIVE mg/dL
KETONES UR: 40 mg/dL — AB
LEUKOCYTES UA: NEGATIVE
NITRITE: NEGATIVE
PH: 7 (ref 5.0–8.0)
PROTEIN: NEGATIVE mg/dL
Specific Gravity, Urine: 1.03 (ref 1.005–1.030)

## 2015-10-12 LAB — URINE MICROSCOPIC-ADD ON

## 2015-10-12 LAB — POC URINE PREG, ED: Preg Test, Ur: NEGATIVE

## 2015-10-12 MED ORDER — GLYCOPYRROLATE 0.2 MG/ML IJ SOLN
0.1000 mg | Freq: Once | INTRAMUSCULAR | Status: AC
  Administered 2015-10-12: 0.1 mg via INTRAVENOUS
  Filled 2015-10-12: qty 1

## 2015-10-12 MED ORDER — DICYCLOMINE HCL 20 MG PO TABS
20.0000 mg | ORAL_TABLET | Freq: Two times a day (BID) | ORAL | Status: DC
Start: 2015-10-12 — End: 2015-11-18

## 2015-10-12 MED ORDER — ONDANSETRON HCL 4 MG/2ML IJ SOLN
4.0000 mg | Freq: Once | INTRAMUSCULAR | Status: AC
  Administered 2015-10-12: 4 mg via INTRAVENOUS
  Filled 2015-10-12: qty 2

## 2015-10-12 MED ORDER — ONDANSETRON 4 MG PO TBDP: 4.0000 mg | ORAL_TABLET | Freq: Three times a day (TID) | ORAL | Status: DC | PRN

## 2015-10-12 MED ORDER — DIPHENOXYLATE-ATROPINE 2.5-0.025 MG PO TABS
2.0000 | ORAL_TABLET | Freq: Once | ORAL | Status: AC
  Administered 2015-10-12: 2 via ORAL
  Filled 2015-10-12: qty 2

## 2015-10-12 MED ORDER — DIPHENOXYLATE-ATROPINE 2.5-0.025 MG PO TABS: 1.0000 | ORAL_TABLET | Freq: Four times a day (QID) | ORAL | Status: DC | PRN

## 2015-10-12 MED ORDER — SODIUM CHLORIDE 0.9 % IV BOLUS (SEPSIS)
1000.0000 mL | Freq: Once | INTRAVENOUS | Status: AC
  Administered 2015-10-12: 1000 mL via INTRAVENOUS

## 2015-10-12 NOTE — ED Notes (Signed)
Per EMS pt from home with sudden onset of n/v/d and abdominal pain at 11pm last night. Husband at home is having the same symptoms.  Zofran and 20 IV left AC. BP 104/72 HR 90. CBG 104

## 2015-10-12 NOTE — ED Provider Notes (Signed)
CSN: [id number]     Arrival date & time 10/12/15  0631 History   First MD Initiated Contact with Patient 10/12/15 [phone]     Chief Complaint  Patient presents with  . Abdominal Pain  . Nausea  . Emesis     HPI  Patient resents for evaluation of nausea vomiting diarrhea and abdominal pain that started last p.m. By 1 AM has had several episodes of nausea and vomiting. No diarrhea and abdominal cramping. Husband, and infant child at home have similar symptoms. No hematemesis. Heme-negative nonbilious. No bright red blood per rectum. No fevers chills. No cough difficult breathing.  Past Medical History  Diagnosis Date  . GERD (gastroesophageal reflux disease)   . History of colon polyps   . Allergy   . Gastritis    Past Surgical History  Procedure Laterality Date  . Colon polyps  removed   Family History  Problem Relation Age of Onset  . Hypertension Mother   . Diabetes Mother   . Diabetes Father    Social History  Substance Use Topics  . Smoking status: Never Smoker   . Smokeless tobacco: None  . Alcohol Use: 0.6 oz/week    1 Standard drinks or equivalent per week   OB History    Gravida Para Term Preterm AB TAB SAB Ectopic Multiple Living   0  1     Review of Systems  Constitutional: Negative for fever, chills, diaphoresis, appetite change and fatigue.  HENT: Negative for mouth sores, sore throat and trouble swallowing.   Eyes: Negative for visual disturbance.  Respiratory: Negative for cough, chest tightness, shortness of breath and wheezing.   Cardiovascular: Negative for chest pain.  Gastrointestinal: Positive for nausea, vomiting, abdominal pain and diarrhea. Negative for abdominal distention.  Endocrine: Negative for polydipsia, polyphagia and polyuria.  Genitourinary: Negative for dysuria, frequency and hematuria.  Musculoskeletal: Negative for gait problem.  Skin: Negative for color change, pallor and rash.  Neurological: Negative for dizziness,  syncope, light-headedness and headaches.  Hematological: Does not bruise/bleed easily.  Psychiatric/Behavioral: Negative for behavioral problems and confusion.      Allergies  Toradol  Home Medications   Prior to Admission medications   Medication Sig Start Date End Date Taking? Authorizing Provider  diphenhydrAMINE (BENADRYL) 25 MG tablet Take 25 mg by mouth every 6 (six) hours as needed for itching, allergies or sleep.   Yes Historical Provider, MD  ranitidine (ZANTAC) 150 MG tablet Take 150 mg by mouth daily.    Yes Historical Provider, MD  dicyclomine (BENTYL) 20 MG tablet Take 1 tablet (20 mg total) by mouth 2 (two) times daily. 10/12/15   Rolland Porter, MD  diphenoxylate-atropine (LOMOTIL) 2.5-0.025 MG tablet Take 1 tablet by mouth 4 (four) times daily as needed for diarrhea or loose stools. 10/12/15   Rolland Porter, MD  fluticasone (FLONASE) 50 MCG/ACT nasal spray Place 2 sprays into both nostrils daily. Patient not taking: Reported on 09/19/2015 06/05/15   Ethelda Chick, MD  hydrocortisone (ANUSOL-HC) 25 MG suppository Place 1 suppository (25 mg total) rectally 2 (two) times daily. Patient not taking: Reported on 10/12/2015 09/19/15   Ethelda Chick, MD  lansoprazole (PREVACID) 30 MG capsule Take 1 capsule (30 mg total) by mouth daily at 12 noon. Patient not taking: Reported on 10/12/2015 09/19/15   Ethelda Chick, MD  loratadine (CLARITIN) 10 MG tablet Take 1 tablet (10 mg total) by mouth daily. Patient not taking: Reported on  09/19/2015 06/05/15   Ethelda Chick, MD  ondansetron (ZOFRAN ODT) 4 MG disintegrating tablet Take 1 tablet (4 mg total) by mouth every 8 (eight) hours as needed for nausea. 10/12/15   Rolland Porter, MD  pantoprazole (PROTONIX) 40 MG tablet Take 1 tablet (40 mg total) by mouth daily. Patient not taking: Reported on 10/12/2015 09/29/15   Ethelda Chick, MD   BP 102/66 mmHg  Pulse 88  Temp(Src) 98.1 F (36.7 C) (Oral)  Resp 15  SpO2 98%  LMP 10/11/2015 (Exact  Date) Physical Exam  Constitutional: She is oriented to person, place, and time. She appears well-developed and well-nourished. No distress.  HENT:  Head: Normocephalic.  Eyes: Conjunctivae are normal. Pupils are equal, round, and reactive to light. No scleral icterus.  Neck: Normal range of motion. Neck supple. No thyromegaly present.  Cardiovascular: Normal rate and regular rhythm.  Exam reveals no gallop and no friction rub.   No murmur heard. Pulmonary/Chest: Effort normal and breath sounds normal. No respiratory distress. She has no wheezes. She has no rales.  Abdominal: Soft. Bowel sounds are normal. She exhibits no distension. There is no tenderness. There is no rebound.  Musculoskeletal: Normal range of motion.  Neurological: She is alert and oriented to person, place, and time.  Skin: Skin is warm and dry. No rash noted.  Psychiatric: She has a normal mood and affect. Her behavior is normal.    ED Course  Procedures (including critical care time) Labs Review Labs Reviewed  COMPREHENSIVE METABOLIC PANEL - Abnormal; Notable for the following:    Potassium 3.4 (*)    Glucose, Bld 122 (*)    Total Protein 8.3 (*)    Albumin 5.2 (*)    All other components within normal limits  CBC - Abnormal; Notable for the following:    WBC 14.8 (*)    All other components within normal limits  URINALYSIS, ROUTINE W REFLEX MICROSCOPIC (NOT AT [facility]) - Abnormal; Notable for the following:    Hgb urine dipstick LARGE (*)    Ketones, ur 40 (*)    All other components within normal limits  URINE MICROSCOPIC-ADD ON - Abnormal; Notable for the following:    Squamous Epithelial / LPF 0-5 (*)    Bacteria, UA RARE (*)    All other components within normal limits  LIPASE, BLOOD  POC URINE PREG, ED    Imaging Review No results found. I have personally reviewed and evaluated these images and lab results as part of my medical decision-making.   EKG Interpretation None      MDM   Final  diagnoses:  Viral gastroenteritis    Right abdomen. Reassuring labs. Family of similar symptoms. Very likely acute viral gastroenteritis. Given IV fluids, Zofran, by mouth Lomotil. Plan discharge home. Bentyl, Zofran, Lomotil. Clear liquids. Advance diet.    Rolland Porter, MD 10/12/15 [phone]

## 2015-10-12 NOTE — Discharge Instructions (Signed)
Gastroenteritis viral  (Viral Gastroenteritis)  La gastroenteritis viral también es conocida como gripe del estómago. Este trastorno afecta el estómago y el tubo digestivo. Puede causar diarrea y vómitos repentinos. La enfermedad generalmente dura entre 3 y 8 días. La mayoría de las personas desarrolla una respuesta inmunológica. Con el tiempo, esto elimina el virus. Mientras se desarrolla esta respuesta natural, el virus puede afectar en forma importante su salud.   CAUSAS  Muchos virus diferentes pueden causar gastroenteritis, por ejemplo el rotavirus o el norovirus. Estos virus pueden contagiarse al consumir alimentos o agua contaminados. También puede contagiarse al compartir utensilios u otros artículos personales con una persona infectada o al tocar una superficie contaminada.   SÍNTOMAS  Los síntomas más comunes son diarrea y vómitos. Estos problemas pueden causar una pérdida grave de líquidos corporales(deshidratación) y un desequilibrio de sales corporales(electrolitos). Otros síntomas pueden ser:   · Fiebre.  · Dolor de cabeza.  · Fatiga.  · Dolor abdominal.  DIAGNÓSTICO   El médico podrá hacer el diagnóstico de gastroenteritis viral basándose en los síntomas y el examen físico También pueden tomarle una muestra de materia fecal para diagnosticar la presencia de virus u otras infecciones.   TRATAMIENTO  Esta enfermedad generalmente desaparece sin tratamiento. Los tratamientos están dirigidos a la rehidratación. Los casos más graves de gastroenteritis viral implican vómitos tan intensos que no es posible retener líquidos. En estos casos, los líquidos deben administrarse a través de una vía intravenosa (IV).   INSTRUCCIONES PARA EL CUIDADO DOMICILIARIO  · Beba suficientes líquidos para mantener la orina clara o de color amarillo pálido. Beba pequeñas cantidades de líquido con frecuencia y aumente la cantidad según la tolerancia.  · Pida instrucciones específicas a su médico con respecto a la  rehidratación.  · Evite:    Alimentos que tengan mucha azúcar.    Alcohol.    Gaseosas.    Tabaco.    Jugos.    Bebidas con cafeína.    Líquidos muy calientes o fríos.    Alimentos muy grasos.    Comer demasiado a la vez.    Productos lácteos hasta 24 a 48 horas después de que se detenga la diarrea.  · Puede consumir probióticos. Los probióticos son cultivos activos de bacterias beneficiosas. Pueden disminuir la cantidad y el número de deposiciones diarreicas en el adulto. Se encuentran en los yogures con cultivos activos y en los suplementos.  · Lave bien sus manos para evitar que se disemine el virus.  · Sólo tome medicamentos de venta libre o recetados para calmar el dolor, las molestias o bajar la fiebre según las indicaciones de su médico. No administre aspirina a los niños. Los medicamentos antidiarreicos no son recomendables.  · Consulte a su médico si puede seguir tomando sus medicamentos recetados o de venta libre.  · Cumpla con todas las visitas de control, según le indique su médico.  SOLICITE ATENCIÓN MÉDICA DE INMEDIATO SI:  · No puede retener líquidos.  · No hay emisión de orina durante 6 a 8 horas.  · Le falta el aire.  · Observa sangre en el vómito (se ve como café molido) o en la materia fecal.  · Siente dolor abdominal que empeora o se concentra en una zona pequeña (se localiza).  · Tiene náuseas o vómitos persistentes.  · Tiene fiebre.  · El paciente es un niño menor de 3 meses y tiene fiebre.  · El paciente es un niño mayor de 3 meses, tiene fiebre y síntomas persistentes.  ·   El paciente es un niño mayor de 3 meses y tiene fiebre y síntomas que empeoran repentinamente.  · El paciente es un bebé y no tiene lágrimas cuando llora.  ASEGÚRESE QUE:   · Comprende estas instrucciones.  · Controlará su enfermedad.  · Solicitará ayuda inmediatamente si no mejora o si empeora.     Esta información no tiene como fin reemplazar el consejo del médico. Asegúrese de hacerle al médico cualquier pregunta que  tenga.     Document Released: 08/13/2005 Document Revised: 11/05/2011  Elsevier Interactive Patient Education ©2016 Elsevier Inc.

## 2015-11-15 ENCOUNTER — Encounter: Payer: Self-pay | Admitting: *Deleted

## 2015-11-18 ENCOUNTER — Ambulatory Visit (INDEPENDENT_AMBULATORY_CARE_PROVIDER_SITE_OTHER): Payer: Managed Care, Other (non HMO) | Admitting: Women's Health

## 2015-11-18 ENCOUNTER — Encounter: Payer: Self-pay | Admitting: Women's Health

## 2015-11-18 VITALS — BP 118/78 | Ht 64.0 in | Wt 130.0 lb

## 2015-11-18 DIAGNOSIS — N938 Other specified abnormal uterine and vaginal bleeding: Secondary | ICD-10-CM | POA: Diagnosis not present

## 2015-11-18 DIAGNOSIS — Z30011 Encounter for initial prescription of contraceptive pills: Secondary | ICD-10-CM

## 2015-11-18 LAB — WET PREP FOR TRICH, YEAST, CLUE
Clue Cells Wet Prep HPF POC: NONE SEEN
Trich, Wet Prep: NONE SEEN
WBC WET PREP: NONE SEEN
YEAST WET PREP: NONE SEEN

## 2015-11-18 LAB — PROLACTIN: PROLACTIN: 19.9 ng/mL

## 2015-11-18 LAB — TSH: TSH: 2.71 m[IU]/L

## 2015-11-18 LAB — HCG, SERUM, QUALITATIVE: Preg, Serum: NEGATIVE

## 2015-11-18 MED ORDER — NORGESTIMATE-ETH ESTRADIOL 0.25-35 MG-MCG PO TABS
1.0000 | ORAL_TABLET | Freq: Every day | ORAL | Status: DC
Start: 2015-11-18 — End: 2017-05-07

## 2015-11-18 NOTE — Addendum Note (Signed)
Addended by: Rushie GoltzSPANGLER, Velena Keegan on: 11/18/2015 10:02 AM   Modules accepted: Orders

## 2015-11-18 NOTE — Patient Instructions (Signed)
Metrorragia funcional (Dysfunctional Uterine Bleeding) La metrorragia funcional es una hemorragia anormal proveniente del tero. La metrorragia funcional incluye estos sntomas:  Menstruacin que se adelanta o se atrasa.  Menstruacin menos o ms abundante, o con cogulos sanguneos.  Hemorragias entre los perodos Becton, Dickinson and Companymenstruales.  Ausencia de una o ms menstruaciones.  Hemorragias luego de Sales promotion account executivemantener relaciones sexuales.  Sangrado luego de la menopausia. INSTRUCCIONES PARA EL CUIDADO EN EL HOGAR  Est atenta a cualquier cambio en los sntomas. Estas indicaciones pueden ayudarla con el trastorno: Comidas  Siga una dieta equilibrada. Incluya alimentos con FedExalto contenido de hierro, como hgado, carne, Oceanographermariscos, verduras de hoja verde y Peabodyhuevos.  Si tiene estreimiento:  Beba abundante agua.  Consuma frutas y verduras con alto contenido de agua y Havrefibra, Walkercomo espinaca, zanahorias, frambuesas, manzanas y mango. Medicamentos  Baxter Internationalome los medicamentos de venta libre y los recetados solamente como se lo haya indicado el mdico.  No haga cambios en los medicamentos sin hablar con el mdico.  La aspirina o los medicamentos que la contienen pueden aumentar la hemorragia. No tome esos medicamentos:  Durante la semana previa a Tax adviserla menstruacin.  Durante la Brink's Companymenstruacin.  Si le recetaron comprimidos de hierro, Scientist, forensictmelos como se lo haya indicado el mdico. Estos ayudan a Restaurant manager, fast foodreponer el hierro que el organismo pierde debido a este trastorno. Actividad  Si debe cambiarse el apsito o el tampn ms de una vez cada 2horas:  Acustese con los pies elevados.  Colquese una compresa fra en la parte baja del abdomen.  Haga todo el reposo que pueda hasta que la hemorragia se detenga o disminuya.  No trate de Management consultantadelgazar hasta que la hemorragia se detenga y los niveles de hierro en la sangre se normalicen. Otras indicaciones  MetLifeDurante dos meses, anote lo siguiente:  La fecha de comienzo de Barrister's clerkla  menstruacin.  La fecha de su finalizacin.  Los Rite Aidmomentos en los que tiene una hemorragia anormal.  Los problemas que advierte.  Concurra a todas las visitas de control como se lo haya indicado el mdico. Esto es importante. SOLICITE ATENCIN MDICA SI:  Se siente dbil o que va a desvanecerse.  Tiene nuseas y vmitos.  No puede comer ni beber sin vomitar.  Tiene mareos o diarrea mientras toma los medicamentos.  Est tomando anticonceptivos u hormonas, y desea cambiar o suspender estos medicamentos. SOLICITE ATENCIN MDICA DE INMEDIATO SI:  Tiene escalofros o fiebre.  Debe cambiarse el apsito o el tampn ms de una vez por hora.  La hemorragia se vuelve ms abundante o el flujo menstrual contiene cogulos con ms frecuencia.  Siente dolor en el abdomen.  Pierde la conciencia.  Le aparece una erupcin cutnea.   Esta informacin no tiene Theme park managercomo fin reemplazar el consejo del mdico. Asegrese de hacerle al mdico cualquier pregunta que tenga.   Document Released: 05/23/2005 Document Revised: 05/04/2015 Elsevier Interactive Patient Education 2016 Arvin[employer]vier Inc. Informacin sobre los anticonceptivos orales (Oral Contraception Information) Los anticonceptivos orales (ACO) son medicamentos que se utilizan para Location managerevitar el embarazo. Su funcin es ALLTEL Corporationevitar que los ovarios liberen vulos. Las hormonas de los ACO tambin hacen que el moco cervical se haga ms espeso, lo que evita que el esperma ingrese al tero. Tambin hacen que la membrana que recubre internamente al tero se vuelva ms fina, lo que no permite que el huevo fertilizado se adhiera a la pared del tero. Los ACO son muy efectivos cuando se toman exactamente como se prescriben. Sin embargo, no previenen contra las enfermedades de transmisin sexual (  ETS). La prctica del sexo seguro, como el uso de preservativos, junto con la [place], [place] a prevenir ese tipo de enfermedades.  Antes de tomar la pldora, usted debe hacerse  un examen fsico y un test de Pap. El mdico podr indicarle anlisis de [place], si es necesario. El mdico se asegurar de que usted sea [place] buena candidata para usar anticonceptivos orales. Converse con su mdico acerca de los posibles efectos secundarios de los ACO que podran recetarle. Cuando se inicia el uso de ACO, puede llevar 2 a 3 meses para que su organismo se adapte a los cambios en los niveles hormonales.  TIPOS DE ANTICONCEPTIVOS ORALES  Pldora combinada: esta pldora contiene las hormonas estrgeno y progestina (progesterona sinttica). La pldora combinada viene en envases para [address], [address] o [address] Algunos tipos de pldoras combinadas deben tomarse de manera continua (pldoras para 365 das). En los envases para [address], usted no tomar las pldoras durante 7 [address]  [address] despus de la ltima pldora. En los envases para [address], la pldora se toma [employer]. Las ltimas 7 no contienen hormonas. Ciertos tipos de pldoras tienen ms de 21 pldoras que contienen hormonas. En los envases para [address] las primeras 84 pldoras contienen ambas hormonas y las ltimas 7 pldoras no contienen hormonas o contienen slo Cabin crew.  La minipldora: esta pldora contiene la hormona progesterona solamente. Es necesario tomarla todos los das de [place] continua. Es importante que las tome a la misma hora todos [place]. Viene en envases de 28 pldoras. Las 28 pldoras contienen la hormona.  VENTAJAS DE LOS ANTICONCEPTIVOS ORALES  Disminuye los sntomas premenstruales.   Se [place] para tratar los [employer].   Regula el ciclo menstrual.   Disminuye el ciclo menstrual abundante.   Puede mejorar el acn, segn el tipo de pldora.   Trata hemorragias uterinas anormales.   Trata el sndrome ovrico poliqustico.   Trata la endometriosis.   Pueden usarse como anticonceptivo de Associate Professor.  FACTORES QUE PUEDEN HACER QUE LOS ANTICONCEPTIVOS ORALES SEAN MENOS  EFECTIVOS Pueden ser menos efectivos si:   Olvid tomar la [employer] a la misma hora.   Tiene una enfermedad estomacal o intestinal que disminuye la absorcin de la pldora.   Ingiere simultneamente los anticonceptivos orales junto con otros medicamentos que los hacen menos efectivos, como antibiticos, ciertos medicamentos para el VIH y algunos medicamentos para las convulsiones.   Usted toma anticonceptivos orales que han vencido.   Cuando se [place] el envase de [place], se olvida de recomenzar el uso [employer] 7.  RIESGOS ASOCIADOS AL USO DE ANTICONCEPTIVOS ORALES  Los anticonceptivos orales pueden en algunos casos causar efectos secundarios como:  Dolor de [place].  Nuseas.  Inflamacin mamaria.  Hemorragia vaginal o manchado irregular. Las pldoras combinadas tambin se asocian a un pequeo aumento en el riesgo de:  Cogulos sanguneos.  Ataque cardaco.  Ictus.   Esta informacin no tiene Theme park manager el consejo del mdico. Asegrese de hacerle al mdico cualquier pregunta que tenga.   Document Released: 05/23/2005 Document Revised: 06/03/2013 Elsevier Interactive Patient Education [employer]

## 2015-11-18 NOTE — Progress Notes (Addendum)
Patient ID: Teresa Hurst, female   DOB: 1986/05/20, 30 y.o.   MRN: [id number] Presents with complaint of menstrual cycle lasting longer than it had in the past. Has had regular 7 day monthly cycles/condoms for contraception. Cycles are still monthly, normal time but lasting longer. Cycle in February lasted 10 days and March cycle lasted 12 days. Cycle  first 3 days light dark spotting , 7-10 days of bright red and then 2 days of light brown spotting following cycle. Mild abdominal cramping, denies urinary symptoms or vaginal discharge with odor, occasional vaginal itching. Denies fever, nausea or change in GI. Married/same partner. Viviana SimplerAlis Herrera interpreter.  Exam: Appears well. Abdomen soft nontender, external genitalia within normal limits, speculum exam scant white discharge wet prep negative no noted blood, lesions or blood at cervix. no adnexal fullness or tenderness.  Home UPT negative  Menorrhagia  Plan: TSH, prolactin, Qualitative hCG. Options reviewed, will try Sprintec prescription, proper use, slight risk for blood clots and strokes for 3 months start first day of next cycle, schedule annual exam in July will evaluate menstrual cycle at that time. Reviewed if cycle persists greater than 7 days will proceed to sonohysterogram.  Instructed to call if continued problems with cycle.

## 2015-11-18 NOTE — Addendum Note (Signed)
Addended by: Harrington ChallengerYOUNG, NANCY J on: 11/18/2015 10:44 AM   Modules accepted: Orders

## 2015-11-28 ENCOUNTER — Ambulatory Visit (INDEPENDENT_AMBULATORY_CARE_PROVIDER_SITE_OTHER): Payer: Managed Care, Other (non HMO) | Admitting: Internal Medicine

## 2015-11-28 ENCOUNTER — Encounter: Payer: Self-pay | Admitting: Internal Medicine

## 2015-11-28 VITALS — BP 92/50 | HR 56 | Ht 64.0 in | Wt 130.4 lb

## 2015-11-28 DIAGNOSIS — Z8619 Personal history of other infectious and parasitic diseases: Secondary | ICD-10-CM | POA: Diagnosis not present

## 2015-11-28 DIAGNOSIS — Z86010 Personal history of colonic polyps: Secondary | ICD-10-CM

## 2015-11-28 DIAGNOSIS — R109 Unspecified abdominal pain: Secondary | ICD-10-CM

## 2015-11-28 DIAGNOSIS — K625 Hemorrhage of anus and rectum: Secondary | ICD-10-CM

## 2015-11-28 DIAGNOSIS — R1013 Epigastric pain: Secondary | ICD-10-CM

## 2015-11-28 MED ORDER — NA SULFATE-K SULFATE-MG SULF 17.5-3.13-1.6 GM/177ML PO SOLN: ORAL | Status: DC

## 2015-11-28 MED ORDER — DICYCLOMINE HCL 20 MG PO TABS: 20.0000 mg | ORAL_TABLET | Freq: Three times a day (TID) | ORAL | Status: DC | PRN

## 2015-11-28 NOTE — Progress Notes (Signed)
Patient ID: Teresa Hurst, female   DOB: 25-Apr-1986, 30 y.o.   MRN: [id number] HPI: Teresa Hurst is a 30 yo female with PMH of H. pylori gastritis, report of colon polyps who is seen in consultation at the request of Dr. Katrinka BlazingSmith to evaluate abdominal pain and rectal bleeding. She is here today with her husband, 29-year-old son, and a medical Spanish interpreter. She reports that she is having issues with crampy lower abdominal pain on a regular basis. This is specifically worse 2-3 days before and during her menses. She has been using Bentyl previously prescribed which seems to help some with this pain. She is also noted red blood with bowel movement and with wiping. She also feels anal pain on occasion when passing stool but not with every bowel movement. Occasionally this pain is present in her mid and lower abdomen after bowel movement. Separate from this pain she has an upper abdominal/epigastric discomfort particularly when not using PPI medication. This is worse with eating and is burning in nature. Can be associated with nausea but no nausea or vomiting recently. She denies heartburn while on medication and denies dysphagia and odynophagia. She's currently taking lansoprazole 50 mg daily and ranitidine 150 mg at bedtime. Her last menstrual period was 11/04/2015. She has OB/GYN follow-up scheduled in July 2017. Recently bowel movements a been daily but he tended to be loose during her periods. She denies fever, chills, anorexia or weight loss. Denies rash and oral ulcers.  She has a GI history in [place]. She recalls several colonoscopies performed there the last 4 years ago. She was told after her first colonoscopy performed at age 30 that she had colon polyps and colitis. She does not recall if she has had polyps with each exam but she feels she is overdue for surveillance. At age 30 she was diagnosed with H. pylori and treated with antibiotics. Family history is notable for a first cousin with  colon cancer at age 29. She has another cousin with Crohn's disease and history of peptic ulcer disease.  Past Medical History  Diagnosis Date  . GERD (gastroesophageal reflux disease)   . History of colon polyps   . Allergy   . Gastritis   . Gastric polyp     per patient; in Childhood; in [place]  . Colon polyp     "surgically removed 10 years ago in [place]"  . Migraine headache     Past Surgical History  Procedure Laterality Date  . Colon polyps  removed    Outpatient Prescriptions Prior to Visit  Medication Sig Dispense Refill  . diphenhydrAMINE (BENADRYL) 25 MG tablet Take 25 mg by mouth every 6 (six) hours as needed for itching, allergies or sleep.    . norgestimate-ethinyl estradiol (ORTHO-CYCLEN,SPRINTEC,PREVIFEM) 0.25-35 MG-MCG tablet Take 1 tablet by mouth daily. 3 Package 0  . ranitidine (ZANTAC) 150 MG tablet Take 150 mg by mouth daily.      No facility-administered medications prior to visit.    Allergies  Allergen Reactions  . Toradol [Ketorolac Tromethamine] Rash    Family History  Problem Relation Age of Onset  . Hypertension Mother   . Diabetes Mother   . Diabetes Father   . Colon cancer Cousin   . Colitis Cousin   . Colon cancer      uncle    Social History  Substance Use Topics  . Smoking status: Never Smoker   . Smokeless tobacco: Never Used  . Alcohol Use: No  ROS: As per history of present illness, otherwise negative  BP 92/50 mmHg  Pulse 56  Ht  (1.626 m)  Wt 130 lb 6.4 oz (59.149 kg)  BMI 22.37 kg/m2  LMP 11/04/2015 Constitutional: Well-developed and well-nourished. No distress. HEENT: Normocephalic and atraumatic. Oropharynx is clear and moist. No oropharyngeal exudate. Conjunctivae are normal.  No scleral icterus. Neck: Neck supple. Trachea midline. Cardiovascular: Normal rate, regular rhythm and intact distal pulses. No M/R/G Pulmonary/chest: Effort normal and breath sounds normal. No wheezing, rales or  rhonchi. Abdominal: Soft, Bilateral lower abdominal tenderness without rebound or guarding, nondistended. Bowel sounds active throughout. There are no masses palpable. No hepatosplenomegaly. Rectal: Deferred to colonoscopy Extremities: no clubbing, cyanosis, or edema Lymphadenopathy: No cervical adenopathy noted. Neurological: Alert and oriented to person place and time. Skin: Skin is warm and dry. No rashes noted. Psychiatric: Normal mood and affect. Behavior is normal.  RELEVANT LABS AND IMAGING: CBC    Component Value Date/Time   WBC 14.8* 10/12/2015 0748   WBC 7.7 09/19/2015 1653   RBC 4.45 10/12/2015 0748   RBC 4.16 09/19/2015 1653   HGB 14.2 10/12/2015 0748   HGB 12.9 09/19/2015 1653   HCT 41.6 10/12/2015 0748   HCT 37.5* 09/19/2015 1653   PLT 236 10/12/2015 0748   MCV 93.5 10/12/2015 0748   MCV 90.2 09/19/2015 1653   MCH 31.9 10/12/2015 0748   MCH 31.1 09/19/2015 1653   MCHC 34.1 10/12/2015 0748   MCHC 34.5 09/19/2015 1653   RDW 12.5 10/12/2015 0748   LYMPHSABS 2.0 03/15/2015 1504   MONOABS 0.5 03/15/2015 1504   EOSABS 0.2 03/15/2015 1504   BASOSABS 0.0 03/15/2015 1504    CMP     Component Value Date/Time   NA 141 10/12/2015 0748   K 3.4* 10/12/2015 0748   CL 108 10/12/2015 0748   CO2 25 10/12/2015 0748   GLUCOSE 122* 10/12/2015 0748   BUN 14 10/12/2015 0748   CREATININE 0.69 10/12/2015 0748   CREATININE 0.85 09/19/2015 1636   CALCIUM 9.4 10/12/2015 0748   PROT 8.3* 10/12/2015 0748   ALBUMIN 5.2* 10/12/2015 0748   AST 22 10/12/2015 0748   ALT 17 10/12/2015 0748   ALKPHOS 49 10/12/2015 0748   BILITOT 1.2 10/12/2015 0748   GFRNONAA >60 10/12/2015 0748   GFRAA >60 10/12/2015 0748    ASSESSMENT/PLAN: 30 yo female with PMH of H. pylori gastritis, report of colon polyps who is seen in consultation at the request of Dr. Katrinka Blazing to evaluate abdominal pain and rectal bleeding.  1. Lower abd pain/rectal bleeding/anorectal pain/hx of polyps -- some of her  symptoms sound irritable bowel in nature. Also given proximity to menstrual cycles endometriosis is a possibility. Her bleeding and anorectal pain may be secondary to fissure however given her abdominal pain and reported history of polyps I recommended repeat colonoscopy. Rule out polyps, rule out colitis. We discussed risks, benefits and alternatives and she is agreeable to proceed. I will prescribed Bentyl 20 mg to be used 3 times daily as needed. This medication is been effective for her in the past. We'll attempt to obtain records from [place] She will ask her mother about the name of the GI clinic and physician who performed her procedures.  2. History of gastritis/history of H. pylori/epigastric pain -- we discussed how she may have recurrent H. pylori given some of her upper GI symptoms. She will continue lansoprazole 50 mg 30 minutes before breakfast and ranitidine 150 mg at bedtime for now.  3. Painful menstruation -- she has GYN follow-up in place    ZO:XWRUEA Dwan Bolt, Md [address] [place], [place] [address]

## 2015-11-28 NOTE — Patient Instructions (Addendum)
You have been scheduled for an endoscopy and colonoscopy. Please follow the written instructions given to you at your visit today. Please pick up your prep supplies at the pharmacy within the next 1-3 days. If you use inhalers (even only as needed), please bring them with you on the day of your procedure. Your physician has requested that you go to www.startemmi.com and enter the access code given to you at your visit today. This web site gives a general overview about your procedure. However, you should still follow specific instructions given to you by our office regarding your preparation for the procedure.  We have sent the following medications to your pharmacy for you to pick up at your convenience: Bentyl 20 mg three times daily as needed  Please continue over the counter Prevacid and Zantac.  Please get your records from [place] and get them to us.  If you are age 29 or older, your body mass index should be between 23-30. Your Body mass index is 22.37 kg/(m^2). If this is out of the aforementioned range listed, please consider follow up with your Primary Care Provider.  If you are age 29 or younger, your body mass index should be between 19-25. Your Body mass index is 22.37 kg/(m^2). If this is out of the aformentioned range listed, please consider follow up with your Primary Care Provider.

## 2016-01-10 ENCOUNTER — Encounter: Payer: Self-pay | Admitting: Internal Medicine

## 2016-01-12 ENCOUNTER — Ambulatory Visit (INDEPENDENT_AMBULATORY_CARE_PROVIDER_SITE_OTHER): Payer: Managed Care, Other (non HMO) | Admitting: Gynecology

## 2016-01-12 ENCOUNTER — Encounter: Payer: Self-pay | Admitting: Gynecology

## 2016-01-12 VITALS — BP 118/76

## 2016-01-12 DIAGNOSIS — N920 Excessive and frequent menstruation with regular cycle: Secondary | ICD-10-CM

## 2016-01-12 DIAGNOSIS — Z3009 Encounter for other general counseling and advice on contraception: Secondary | ICD-10-CM

## 2016-01-12 NOTE — Patient Instructions (Signed)
Levonorgestrel intrauterine device (IUD) What is this medicine? LEVONORGESTREL IUD (LEE voe nor jes trel) is a contraceptive (birth control) device. The device is placed inside the uterus by a healthcare professional. It is used to prevent pregnancy and can also be used to treat heavy bleeding that occurs during your period. Depending on the device, it can be used for 3 to 5 years. This medicine may be used for other purposes; ask your health care provider or pharmacist if you have questions. What should I tell my health care provider before I take this medicine? They need to know if you have any of these conditions: -abnormal Pap smear -cancer of the breast, uterus, or cervix -diabetes -endometritis -genital or pelvic infection now or in the past -have more than one sexual partner or your partner has more than one partner -heart disease -history of an ectopic or tubal pregnancy -immune system problems -IUD in place -liver disease or tumor -problems with blood clots or take blood-thinners -use intravenous drugs -uterus of unusual shape -vaginal bleeding that has not been explained -an unusual or allergic reaction to levonorgestrel, other hormones, silicone, or polyethylene, medicines, foods, dyes, or preservatives -pregnant or trying to get pregnant -breast-feeding How should I use this medicine? This device is placed inside the uterus by a health care professional. Talk to your pediatrician regarding the use of this medicine in children. Special care may be needed. Overdosage: If you think you have taken too much of this medicine contact a poison control center or emergency room at once. NOTE: This medicine is only for you. Do not share this medicine with others. What if I miss a dose? This does not apply. What may interact with this medicine? Do not take this medicine with any of the following medications: -amprenavir -bosentan -fosamprenavir This medicine may also interact with  the following medications: -aprepitant -barbiturate medicines for inducing sleep or treating seizures -bexarotene -griseofulvin -medicines to treat seizures like carbamazepine, ethotoin, felbamate, oxcarbazepine, phenytoin, topiramate -modafinil -pioglitazone -rifabutin -rifampin -rifapentine -some medicines to treat HIV infection like atazanavir, indinavir, lopinavir, nelfinavir, tipranavir, ritonavir -St. John's wort -warfarin This list may not describe all possible interactions. Give your health care provider a list of all the medicines, herbs, non-prescription drugs, or dietary supplements you use. Also tell them if you smoke, drink alcohol, or use illegal drugs. Some items may interact with your medicine. What should I watch for while using this medicine? Visit your doctor or health care professional for regular check ups. See your doctor if you or your partner has sexual contact with others, becomes HIV positive, or gets a sexual transmitted disease. This product does not protect you against HIV infection (AIDS) or other sexually transmitted diseases. You can check the placement of the IUD yourself by reaching up to the top of your vagina with clean fingers to feel the threads. Do not pull on the threads. It is a good habit to check placement after each menstrual period. Call your doctor right away if you feel more of the IUD than just the threads or if you cannot feel the threads at all. The IUD may come out by itself. You may become pregnant if the device comes out. If you notice that the IUD has come out use a backup birth control method like condoms and call your health care provider. Using tampons will not change the position of the IUD and are okay to use during your period. What side effects may I notice from receiving this medicine?   Side effects that you should report to your doctor or health care professional as soon as possible: -allergic reactions like skin rash, itching or  hives, swelling of the face, lips, or tongue -fever, flu-like symptoms -genital sores -high blood pressure -no menstrual period for 6 weeks during use -pain, swelling, warmth in the leg -pelvic pain or tenderness -severe or sudden headache -signs of pregnancy -stomach cramping -sudden shortness of breath -trouble with balance, talking, or walking -unusual vaginal bleeding, discharge -yellowing of the eyes or skin Side effects that usually do not require medical attention (report to your doctor or health care professional if they continue or are bothersome): -acne -breast pain -change in sex drive or performance -changes in weight -cramping, dizziness, or faintness while the device is being inserted -headache -irregular menstrual bleeding within first 3 to 6 months of use -nausea This list may not describe all possible side effects. Call your doctor for medical advice about side effects. You may report side effects to FDA at 1-800-FDA-1088. Where should I keep my medicine? This does not apply. NOTE: This sheet is a summary. It may not cover all possible information. If you have questions about this medicine, talk to your doctor, pharmacist, or health care provider.    2016, Elsevier/Gold Standard. (2011-09-13 13:54:04)  

## 2016-01-12 NOTE — Progress Notes (Signed)
   HPI: Patient is a 30 year old gravida 1 para 1 who was seen in March of this year by her nurse practitioner as a result of her heavy menstrual cycles over lasting up to 7 days and the first few days with large clots. She stopped her oral contraceptive pill last month and has had no issues except heavy cycle but she cannot tolerate the birth control pill she did not feel right when she was on it. Patient with no GU or GI complaints. Patient had a normal Pap smear in 2016.  ROS: A ROS was performed and pertinent positives and negatives are included in the history.  GENERAL: No fevers or chills. HEENT: No change in vision, no earache, sore throat or sinus congestion. NECK: No pain or stiffness. CARDIOVASCULAR: No chest pain or pressure. No palpitations. PULMONARY: No shortness of breath, cough or wheeze. GASTROINTESTINAL: No abdominal pain, nausea, vomiting or diarrhea, melena or bright red blood per rectum. GENITOURINARY: No urinary frequency, urgency, hesitancy or dysuria. MUSCULOSKELETAL: No joint or muscle pain, no back pain, no recent trauma. DERMATOLOGIC: No rash, no itching, no lesions. ENDOCRINE: No polyuria, polydipsia, no heat or cold intolerance. No recent change in weight. HEMATOLOGICAL: No anemia or easy bruising or bleeding. NEUROLOGIC: No headache, seizures, numbness, tingling or weakness. PSYCHIATRIC: No depression, no loss of interest in normal activity or change in sleep pattern.   PE: Blood pressure 118/76 Gen. appearance well-developed well-nourished female with the above-mentioned complaint Abdomen: Soft nontender no rebound or guarding Back: No CVA tenderness Pelvic: Bartholin urethra Skene was within normal limits Vagina: No lesions or discharge Cervix: No lesions or discharge Uterus: Anteverted normal size shape and consistency Adnexa: No palpable mass or tenderness Rectal exam not done   Assessment Plan: 30 year old with menorrhagia could not tolerate oral contraceptive  pill. We discussed other treatment options to include Depo-Provera injection every 3 months also we discussed the NuvaRing intravaginal ring as well as the different types of IUD from the nonhormonal ParaGard T380A IUD as well as the progesterone only Mirena IUD. Literature information was provided. She will read the information and schedule for placement of the Mirena IUD at time of her next menstrual cycle. All questions were answered all these lesions were in Spanish.    Greater than 50% of time was spent in counseling and coordinating care of this patient.   Time of consultation: 15   Minutes.

## 2016-01-17 ENCOUNTER — Telehealth: Payer: Self-pay | Admitting: Gynecology

## 2016-01-17 NOTE — Telephone Encounter (Signed)
01/17/16-Per pt [employer] ins the Mirena and insertion for contraception would be covered at 100%, no copay. Claudia to call pt with this information as pt primarily speaks [place]. [id number]

## 2016-01-24 ENCOUNTER — Ambulatory Visit (AMBULATORY_SURGERY_CENTER): Payer: Managed Care, Other (non HMO) | Admitting: Internal Medicine

## 2016-01-24 ENCOUNTER — Encounter: Payer: Self-pay | Admitting: Internal Medicine

## 2016-01-24 VITALS — BP 98/64 | HR 68 | Temp 98.6°F | Resp 14 | Ht 64.0 in | Wt 130.0 lb

## 2016-01-24 DIAGNOSIS — R109 Unspecified abdominal pain: Secondary | ICD-10-CM

## 2016-01-24 DIAGNOSIS — Z86010 Personal history of colonic polyps: Secondary | ICD-10-CM | POA: Diagnosis not present

## 2016-01-24 DIAGNOSIS — Z8619 Personal history of other infectious and parasitic diseases: Secondary | ICD-10-CM

## 2016-01-24 DIAGNOSIS — K625 Hemorrhage of anus and rectum: Secondary | ICD-10-CM | POA: Diagnosis not present

## 2016-01-24 DIAGNOSIS — K2950 Unspecified chronic gastritis without bleeding: Secondary | ICD-10-CM | POA: Diagnosis not present

## 2016-01-24 MED ORDER — SODIUM CHLORIDE 0.9 % IV SOLN: 500.0000 mL | INTRAVENOUS | Status: DC

## 2016-01-24 NOTE — Op Note (Signed)
[facility] Patient Name: Teresa Hurst Procedure Date: 01/24/2016 8:31 AM MRN: [id number] Endoscopist: Beverley FiedlerJay M Pyrtle , MD Age: 30 Referring MD:  Date of Birth: 06-May-1986 Gender: Female Procedure:                Colonoscopy Indications:              Lower abdominal pain, Rectal bleeding, reported                            history of colonic polyps Medicines:                Monitored Anesthesia Care Procedure:                Pre-Anesthesia Assessment:                           - Prior to the procedure, a History and Physical                            was performed, and patient medications and                            allergies were reviewed. The patient's tolerance of                            previous anesthesia was also reviewed. The risks                            and benefits of the procedure and the sedation                            options and risks were discussed with the patient.                            All questions were answered, and informed consent                            was obtained. Prior Anticoagulants: The patient has                            taken no previous anticoagulant or antiplatelet                            agents. ASA Grade Assessment: II - A patient with                            mild systemic disease. After reviewing the risks                            and benefits, the patient was deemed in                            satisfactory condition to undergo the procedure.  After obtaining informed consent, the colonoscope                            was passed under direct vision. Throughout the                            procedure, the patient's blood pressure, pulse, and                            oxygen saturations were monitored continuously. The                            Model PCF-[id number] scope was introduced                            through the anus and advanced to the the terminal                          ileum. The colonoscopy was performed without                            difficulty. The patient tolerated the procedure                            well. The quality of the bowel preparation was                            excellent. The terminal ileum, ileocecal valve,                            appendiceal orifice, and rectum were photographed. Scope In: 8:34:08 AM Scope Out: 8:44:38 AM Scope Withdrawal Time: 0 hours 8 minutes 20 seconds  Total Procedure Duration: 0 hours 10 minutes 30 seconds  Findings:                 The digital rectal exam was normal.                           The terminal ileum appeared normal.                           A localized area of mildly erythematous mucosa was                            found in the distal rectum (query prep artifact).                            Three biopsies were obtained with cold forceps for                            histology in a targeted manner.                           The exam was otherwise without abnormality on  direct and retroflexion views. Complications:            No immediate complications. Estimated Blood Loss:     Estimated blood loss: none. Impression:               - The examined portion of the ileum was normal.                           - Erythematous mucosa in the distal rectum.                            Nonspecific. Biopsied.                           - The examination was otherwise normal on direct                            and retroflexion views. Recommendation:           - Patient has a contact number available for                            emergencies. The signs and symptoms of potential                            delayed complications were discussed with the                            patient. Return to normal activities tomorrow.                            Written discharge instructions were provided to the                            patient.                            - Resume previous diet.                           - Continue present medications.                           - Await pathology results.                           - Repeat colonoscopy is recommended. The                            colonoscopy date will be determined after pathology                            results from today's exam become available for                            review. Beverley Fiedler, MD 01/24/2016 8:53:43 AM This report has been signed electronically.

## 2016-01-24 NOTE — Op Note (Signed)
[facility] Patient Name: Teresa MagicValeria Hurst Procedure Date: 01/24/2016 8:14 AM MRN: [id number] Endoscopist: Beverley FiedlerJay M Niquan Charnley , MD Age: [age] Referring MD:  Date of Birth: [birth year] Gender: Female Procedure:                Upper GI endoscopy Indications:              Epigastric abdominal pain, Follow-up of                            Helicobacter pylori Medicines:                Monitored Anesthesia Care Procedure:                Pre-Anesthesia Assessment:                           - Prior to the procedure, a History and Physical                            was performed, and patient medications and                            allergies were reviewed. The patient's tolerance of                            previous anesthesia was also reviewed. The risks                            and benefits of the procedure and the sedation                            options and risks were discussed with the patient.                            All questions were answered, and informed consent                            was obtained. Prior Anticoagulants: The patient has                            taken no previous anticoagulant or antiplatelet                            agents. ASA Grade Assessment: II - A patient with                            mild systemic disease. After reviewing the risks                            and benefits, the patient was deemed in                            satisfactory condition to undergo the procedure.  After obtaining informed consent, the endoscope was                            passed under direct vision. Throughout the                            procedure, the patient's blood pressure, pulse, and                            oxygen saturations were monitored continuously. The                            Model GIF-HQ190 [id number]) scope was introduced                            through the mouth, and advanced to the second part                 of duodenum. The upper GI endoscopy was                            accomplished without difficulty. The patient                            tolerated the procedure well. Scope In: Scope Out: Findings:                 The examined esophagus was normal.                           The entire examined stomach was normal. Biopsies                            were taken with a cold forceps for histology and                            Helicobacter pylori testing.                           The cardia and gastric fundus were normal on                            retroflexion.                           The examined duodenum was normal. Complications:            No immediate complications. Estimated Blood Loss:     Estimated blood loss: none. Impression:               - Normal esophagus.                           - Normal stomach. Biopsied.                           - Normal examined duodenum. Recommendation:           - Patient has a contact  number available for                            emergencies. The signs and symptoms of potential                            delayed complications were discussed with the                            patient. Return to normal activities tomorrow.                            Written discharge instructions were provided to the                            patient.                           - Resume previous diet.                           - Continue present medications.                           - Await pathology results.                           - Perform a colonoscopy today. Beverley Fiedler, MD 01/24/2016 8:48:51 AM This report has been signed electronically.

## 2016-01-24 NOTE — Progress Notes (Signed)
Report to PACU, RN, vss, BBS= Clear.  

## 2016-01-24 NOTE — Progress Notes (Signed)
Called to room to assist during endoscopic procedure.  Patient ID and intended procedure confirmed with present staff. Received instructions for my participation in the procedure from the performing physician.  

## 2016-01-24 NOTE — Progress Notes (Signed)
Very small amount of bloody rectal discharge while in restroom. Reviewed again symptoms to report.

## 2016-01-24 NOTE — Progress Notes (Signed)
Patient stating she  Has to call for her ride. Patient to recliner room awaiting transportation.

## 2016-01-24 NOTE — Patient Instructions (Signed)
YOU HAD AN ENDOSCOPIC PROCEDURE TODAY AT THE [facility]:   Refer to the procedure report that was given to you for any specific questions about what was found during the examination.  If the procedure report does not answer your questions, please call your gastroenterologist to clarify.  If you requested that your care partner not be given the details of your procedure findings, then the procedure report has been included in a sealed envelope for you to review at your convenience later.  YOU SHOULD EXPECT: Some feelings of bloating in the abdomen. Passage of more gas than usual.  Walking can help get rid of the air that was put into your GI tract during the procedure and reduce the bloating. If you had a lower endoscopy (such as a colonoscopy or flexible sigmoidoscopy) you may notice spotting of blood in your stool or on the toilet paper. If you underwent a bowel prep for your procedure, you may not have a normal bowel movement for a few days.  Please Note:  You might notice some irritation and congestion in your nose or some drainage.  This is from the oxygen used during your procedure.  There is no need for concern and it should clear up in a day or so.  SYMPTOMS TO REPORT IMMEDIATELY:   Following lower endoscopy (colonoscopy or flexible sigmoidoscopy):  Excessive amounts of blood in the stool  Significant tenderness or worsening of abdominal pains  Swelling of the abdomen that is new, acute  Fever of 100F or higher   Following upper endoscopy (EGD)  Vomiting of blood or coffee ground material  New chest pain or pain under the shoulder blades  Painful or persistently difficult swallowing  New shortness of breath  Fever of 100F or higher  Black, tarry-looking stools  For urgent or emergent issues, a gastroenterologist can be reached at any hour by calling [phone].   DIET: Your first meal following the procedure should be a small meal and then it is ok to progress to  your normal diet. Heavy or fried foods are harder to digest and may make you feel nauseous or bloated.  Likewise, meals heavy in dairy and vegetables can increase bloating.  Drink plenty of fluids but you should avoid alcoholic beverages for 24 hours.  ACTIVITY:  You should plan to take it easy for the rest of today and you should NOT DRIVE or use heavy machinery until tomorrow (because of the sedation medicines used during the test).    FOLLOW UP: Our staff will call the number listed on your records the next business day following your procedure to check on you and address any questions or concerns that you may have regarding the information given to you following your procedure. If we do not reach you, we will leave a message.  However, if you are feeling well and you are not experiencing any problems, there is no need to return our call.  We will assume that you have returned to your regular daily activities without incident.  If any biopsies were taken you will be contacted by phone or by letter within the next 1-3 weeks.  Please call us at [phone] if you have not heard about the biopsies in 3 weeks.    SIGNATURES/CONFIDENTIALITY: You and/or your care partner have signed paperwork which will be entered into your electronic medical record.  These signatures attest to the fact that that the information above on your After Visit Summary has been reviewed   and is understood.  Full responsibility of the confidentiality of this discharge information lies with you and/or your care-partner. 

## 2016-01-25 ENCOUNTER — Telehealth: Payer: Self-pay | Admitting: *Deleted

## 2016-01-25 NOTE — Telephone Encounter (Signed)
No answer. Number identifier. Message left to call if questions or concerns. 

## 2016-01-31 ENCOUNTER — Encounter: Payer: Self-pay | Admitting: Internal Medicine

## 2016-03-06 ENCOUNTER — Encounter: Payer: Managed Care, Other (non HMO) | Admitting: Women's Health

## 2016-03-15 ENCOUNTER — Encounter: Payer: Managed Care, Other (non HMO) | Admitting: Women's Health

## 2016-03-15 DIAGNOSIS — Z0289 Encounter for other administrative examinations: Secondary | ICD-10-CM

## 2016-12-07 ENCOUNTER — Ambulatory Visit (INDEPENDENT_AMBULATORY_CARE_PROVIDER_SITE_OTHER): Payer: 59 | Admitting: Urgent Care

## 2016-12-07 ENCOUNTER — Ambulatory Visit (INDEPENDENT_AMBULATORY_CARE_PROVIDER_SITE_OTHER): Payer: 59

## 2016-12-07 VITALS — BP 100/64 | HR 65 | Temp 98.0°F | Resp 17 | Ht 65.0 in | Wt 136.0 lb

## 2016-12-07 DIAGNOSIS — M62838 Other muscle spasm: Secondary | ICD-10-CM

## 2016-12-07 DIAGNOSIS — M546 Pain in thoracic spine: Secondary | ICD-10-CM

## 2016-12-07 DIAGNOSIS — M545 Low back pain, unspecified: Secondary | ICD-10-CM

## 2016-12-07 DIAGNOSIS — L819 Disorder of pigmentation, unspecified: Secondary | ICD-10-CM

## 2016-12-07 DIAGNOSIS — M4180 Other forms of scoliosis, site unspecified: Secondary | ICD-10-CM

## 2016-12-07 DIAGNOSIS — L989 Disorder of the skin and subcutaneous tissue, unspecified: Secondary | ICD-10-CM

## 2016-12-07 LAB — POCT URINE PREGNANCY: PREG TEST UR: NEGATIVE

## 2016-12-07 MED ORDER — NAPROXEN SODIUM 550 MG PO TABS: 550.0000 mg | ORAL_TABLET | Freq: Two times a day (BID) | ORAL | 1 refills | Status: AC

## 2016-12-07 MED ORDER — CYCLOBENZAPRINE HCL 5 MG PO TABS: 5.0000 mg | ORAL_TABLET | Freq: Three times a day (TID) | ORAL | 5 refills | Status: AC | PRN

## 2016-12-07 NOTE — Progress Notes (Signed)
MRN: [id number] DOB: April 26, 1986  Subjective:   Teresa Hurst is a 31 y.o. female presenting for chief complaint of nodule on eyebrow  Skin - Reports longstanding history of lesions over her right eyebrow and upper right chest. They have not bothered patient most of her life but in the past year, states that the lesions have increased in size. They are also itching, has scratched them a lot more recently. This has caused stinging sensations, bleeding. Would like to have them removed.   Back pain - Reports history of scoliosis and longstanding intermittent back pain. Patient had work restrictions while she lived in [place] She would like to be evaluated for the same now. Reports 3 day history of severe mid-low back pain while at work. Has to do lifting and is active with over-head activities. Denies trauma, radiation of her pain, numbness or tingling, history of back surgeries. Has been using naproxen regularly without any relief.   Teresa Hurst has a current medication list which includes the following prescription(s): dicyclomine, diphenhydramine, na sulfate-k sulfate-mg sulf, norgestimate-ethinyl estradiol, and ranitidine. Also is allergic to toradol [ketorolac tromethamine]. Teresa Hurst  has a past medical history of Allergy; Colon polyp; Gastric polyp; Gastritis; GERD (gastroesophageal reflux disease); History of colon polyps; Hypoglycemia; and Migraine headache. Also  has a past surgical history that includes colon polyps (removed) and Colonoscopy.  Objective:   Vitals: BP 100/64   Pulse 65   Temp 98 F (36.7 C) (Oral)   Resp 17   Ht  (1.651 m)   Wt 136 lb (61.7 kg)   LMP 11/06/2016 (Approximate)   SpO2 98%   BMI 22.63 kg/m   Physical Exam  Constitutional: She is oriented to person, place, and time. She appears well-developed and well-nourished.  HENT:  Head:    Cardiovascular: Normal rate.   Pulmonary/Chest: Effort normal.    Musculoskeletal:       Thoracic back: She  exhibits tenderness (over paraspinal muscles) and spasm. She exhibits normal range of motion, no bony tenderness, no swelling, no edema and no deformity.       Lumbar back: She exhibits decreased range of motion (extension), tenderness (over paraspinal muscles) and spasm. She exhibits no bony tenderness, no swelling, no edema and no deformity.  Negative SLR.  Neurological: She is alert and oriented to person, place, and time.   Dg Thoracic Spine 2 View  Result Date: 12/07/2016 CLINICAL DATA:  Dorsalgia EXAM: THORACIC SPINE 3 VIEWS COMPARISON:  None. FINDINGS: Frontal, lateral, and swimmer's views were obtained. There is lower thoracic levoscoliosis. There is no fracture or spondylolisthesis. The disc spaces appear normal. No erosive change. No paraspinous lesion. IMPRESSION: Scoliosis. No fracture or spondylolisthesis. No appreciable arthropathy. Electronically Signed   By: Bretta Bang III M.D.   On: 12/07/2016 16:10   Dg Lumbar Spine Complete  Result Date: 12/07/2016 CLINICAL DATA:  Lumbago EXAM: LUMBAR SPINE - COMPLETE 4+ VIEW COMPARISON:  None. FINDINGS: Frontal, lateral, spot lumbosacral lateral, and bilateral oblique views were obtained. There are 5 non-rib-bearing lumbar type vertebral bodies. There is no fracture or spondylolisthesis. Disc spaces appear normal. There is no appreciable facet arthropathy. IMPRESSION: No fracture or spondylolisthesis.  No evident arthropathic change. Electronically Signed   By: Bretta Bang III M.D.   On: 12/07/2016 16:11   Results for orders placed or performed in visit on 12/07/16 (from the past 24 hour(s))  POCT urine pregnancy     Status: None   Collection Time: 12/07/16  4:21 PM  Result  Value Ref Range   Preg Test, Ur Negative Negative    Assessment and Plan :   1. Skin lesion of face 2. Atypical pigmented skin lesion - Given size of lesion on her face, patient prefers dermatology referral for best possible outcome.  - Ambulatory referral  to Dermatology  3. Acute bilateral low back pain without sciatica 4. Acute bilateral thoracic back pain 5. Levoscoliosis 6. Muscle spasms of neck - Will start conservative management with muscle relaxant. Work restrictions given. Back care reviewed. RTC in 2 weeks if no improvement.  Wallis Bamberg, PA-C Primary Care at [facility] [phone] 12/07/2016  3:32 PM

## 2016-12-07 NOTE — Patient Instructions (Addendum)
Calambres y espasmos musculares (Muscle Cramps and Spasms) Los calambres y los espasmos musculares se producen cuando uno o varios msculos se contraen, y usted no lo puede Chief Operating Officer (Higher education careers adviser involuntaria). Son un problema frecuente y pueden ocurrir en cualquier msculo. El lugar ms comn son los msculos de la pantorrilla. Los calambres y los espasmos musculares son contracciones musculares involuntarias, pero hay algunas diferencias entre ambos:  Los calambres musculares son dolorosos. Aparecen y desaparecen, y pueden durar desde unos pocos segundos hasta . Los calambres musculares suelen ser ms fuertes y durar ms [employer] espasmos musculares.  Estos ltimos pueden o no ser dolorosos. Adems, pueden durar unos pocos segundos o mucho ms tiempo. Determinadas enfermedades, como la diabetes o la enfermedad de Parkinson, pueden aumentar las probabilidades de que se produzcan calambres o espasmos musculares. Sin embargo, los calambres o los espasmos no suelen deberse a un problema preexistente grave. Las causas ms frecuentes son las siguientes:  Public relations account executive.  Hacer movimientos repetitivos de forma excesiva o hacer lo mismo una y [place].  Permanecer en determinada posicin durante un perodo prolongado.  Tener una preparacin, una tcnica o un mtodo inadecuados al Education administrator un deporte o Tax adviser.  Deshidratacin.  Lesiones.  Efectos secundarios de algunos medicamentos.  Niveles muy bajos de las sales y los iones de la sangre (electrolitos), en especial, de potasio y calcio. Esto podra ocurrir si toma diurticos o si est embarazada. En muchos casos, no se conoce la causa de los calambres o los espasmos musculares. INSTRUCCIONES PARA EL CUIDADO EN EL HOGAR  Mantngase bien hidratado. Beba suficiente lquido para Photographer orina clara o de color amarillo plido.  Intente masajear, estirar y Materials engineer msculo afectado.  Si se lo  indican, aplquese calor en los msculos tensos o contrados con la frecuencia establecida por el mdico. Use la fuente de calor que el mdico le recomiende, como una compresa de calor hmedo o una almohadilla trmica.  Coloque una toalla entre la piel y la fuente de Airline pilot.  Aplique el calor durante 20 a .  Retire la fuente de calor si la piel se le pone de color rojo brillante. Esto es muy importante si no puede sentir el dolor, el calor o el fro. Puede correr un riesgo mayor de sufrir quemaduras.  Si se lo indican, aplique hielo sobre la zona afectada. Esto puede ayudarlo si tiene molestias o dolor despus de un calambre o un espasmo.  Ponga el hielo en una bolsa plstica.  Coloque una toalla entre la piel y la bolsa de hielo.  Coloque el hielo durante , 2 a 3veces por Futures trader.  Tome los medicamentos de venta libre y los recetados solamente como se lo haya indicado el mdico.  Est atento a cualquier [employer] sntomas. SOLICITE ATENCIN MDICA SI:  Los calambres o los espasmos se vuelven ms intensos o son ms frecuentes.  Los calambres o los espasmos no mejoran con el [place]. Esta informacin no tiene Theme park manager el consejo del mdico. Asegrese de hacerle al mdico cualquier pregunta que tenga. Document Released: 05/23/2005 Document Revised: 12/08/2012 Document Reviewed: 05/17/2015 Elsevier Interactive Patient Education  2017 [employer].    Escoliosis (Scoliosis) Escoliosis es el nombre del trastorno que hace que la columna vertebral se curve hacia los lados. La escoliosis puede deformar los hombros, la cadera, el pecho, la espalda y la caja torcica. CAUSAS La causa de la escoliosis no siempre se conoce. Puede deberse a un defecto  de nacimiento o por una enfermedad que provoca una disfuncin muscular y [place], como parlisis cerebral y distrofia muscular. FACTORES DE RIESGO Tener una enfermedad que cause disfuncin o enfermedad  muscular. SIGNOS Y SNTOMAS La escoliosis suele no presentar signos ni sntomas. Si se presentan sntomas, pueden incluir:  Tamao distinto de una parte del cuerpo en comparacin con la otra (asimetra).  Curvatura visible de la columna vertebral.  Dolor. El dolor puede limitar la actividad fsica.  Falta de aire.  Problemas de intestinos o vejiga. DIAGNSTICO Un profesional competente realizar una evaluacin. Esto incluir:  Considerar su historia clnica.  Realizar un examen fsico.  Realizar un examen neurolgico para detectar alguna prdida de la funcin muscular o nerviosa.  Estudios sobre el rango de movimiento en la columna vertebral.  Radiografas. Tambin se puede Information systems manager. TRATAMIENTO El tratamiento vara segn la [place], el grado y la gravedad de la enfermedad. Si la curvatura no es importante, puede necesitar observacin nicamente. Se puede usar un soporte ortopdico para evitar que progrese la escoliosis. Este soporte tambin se puede necesitar durante el estirn puberal. La fisioterapia puede ser beneficiosa. Podra ser necesario que se someta a [place]. INSTRUCCIONES PARA EL CUIDADO EN EL HOGAR  El profesional que lo asiste podr indicarle algunos ejercicios para Physiological scientist. Realcelos como se le indica.  Consulte a su mdico antes de participar en algn deporte.  Si le prescribieron un soporte ortopdico, selo como le indic su mdico. SOLICITE ATENCIN MDICA SI: El soporte le provoca llagas en la piel (irritacin) o es incmodo. SOLICITE ATENCIN MDICA DE INMEDIATO SI:  Siente dolor en la espalda y no se alivia con los medicamentos recetados.  Siente debilidad o pierde funcionamiento en las piernas.  Pierde control del intestino o de la vejiga. Esta informacin no tiene Theme park manager el consejo del mdico. Asegrese de hacerle al mdico cualquier pregunta que tenga. Document Released: 05/23/2005  Document Revised: 06/03/2013 Document Reviewed: 02/16/2016 Elsevier Interactive Patient Education  2017 [employer].     IF you received an x-ray today, you will receive an invoice from [facility]. Please contact [facility] at [phone] with questions or concerns regarding your invoice.   IF you received labwork today, you will receive an invoice from [facility]. Please contact [facility] at [phone] with questions or concerns regarding your invoice.   Our billing staff will not be able to assist you with questions regarding bills from these companies.  You will be contacted with the lab results as soon as they are available. The fastest way to get your results is to activate your My Chart account. Instructions are located on the last page of this paperwork. If you have not heard from [place] regarding the results in 2 weeks, please contact this office.

## 2016-12-21 ENCOUNTER — Ambulatory Visit: Payer: 59 | Admitting: Family Medicine

## 2016-12-28 ENCOUNTER — Ambulatory Visit (INDEPENDENT_AMBULATORY_CARE_PROVIDER_SITE_OTHER): Payer: 59 | Admitting: Urgent Care

## 2016-12-28 ENCOUNTER — Encounter: Payer: Self-pay | Admitting: Urgent Care

## 2016-12-28 VITALS — BP 111/74 | HR 89 | Temp 98.6°F | Resp 16 | Ht 65.0 in | Wt 136.4 lb

## 2016-12-28 DIAGNOSIS — M4180 Other forms of scoliosis, site unspecified: Secondary | ICD-10-CM

## 2016-12-28 DIAGNOSIS — K2950 Unspecified chronic gastritis without bleeding: Secondary | ICD-10-CM | POA: Diagnosis not present

## 2016-12-28 MED ORDER — DICYCLOMINE HCL 20 MG PO TABS
20.0000 mg | ORAL_TABLET | Freq: Three times a day (TID) | ORAL | 1 refills | Status: DC | PRN
Start: 2016-12-28 — End: 2017-05-07

## 2016-12-28 NOTE — Progress Notes (Signed)
  MRN: [id number] DOB: [birth year]  Subjective:   Teresa Hurst is a 31 y.o. female presenting for chief complaint of Back Pain (upper to mid back pain x 3 weeks )  Scoliosis - Presenting for follow up on back pain associated with scoliosis. At her last OV, patient was placed on work restrictions, started on conservative management with Anaprox, Flexeril. She noted good improvement but still has intermittent back pain, spasms. She would like to lift some work restrictions. Denies fever, swelling, trauma, falls, numbness, tingling.  IBS/gastritis - Would like a refill of bentyl. She was previously prescribed this for gastritis, IBS by her GI doctor. Does very well with this medication.  Harrison MonsValeria has a current medication list which includes the following prescription(s): cyclobenzaprine, diphenhydramine, na sulfate-k sulfate-mg sulf, naproxen sodium, norgestimate-ethinyl estradiol, ranitidine, and dicyclomine. Also is allergic to toradol [ketorolac tromethamine]. Harrison MonsValeria  has a past medical history of Allergy; Colon polyp; Gastric polyp; Gastritis; GERD (gastroesophageal reflux disease); History of colon polyps; Hypoglycemia; and Migraine headache. Also  has a past surgical history that includes colon polyps (removed) and Colonoscopy.  Objective:   Vitals: BP 111/74   Pulse 89   Temp 98.6 F (37 C) (Oral)   Resp 16   Ht 5\' 5"  (1.651 m)   Wt 136 lb 6.4 oz (61.9 kg)   SpO2 99%   BMI 22.70 kg/m   Physical Exam  Constitutional: She is oriented to person, place, and time. She appears well-developed and well-nourished.  HENT:  Mouth/Throat: Oropharynx is clear and moist.  Cardiovascular: Normal rate, regular rhythm and intact distal pulses.  Exam reveals no gallop and no friction rub.   No murmur heard. Pulmonary/Chest: No respiratory distress. She has no wheezes. She has no rales.  Abdominal: Soft. Bowel sounds are normal. She exhibits no distension and no mass. There is no tenderness.  There is no guarding.  Musculoskeletal:       Thoracic back: She exhibits spasm. She exhibits normal range of motion, no tenderness, no bony tenderness, no swelling, no edema, no deformity and no laceration.       Lumbar back: She exhibits normal range of motion, no tenderness, no bony tenderness, no swelling, no edema, no deformity, no laceration, no pain, no spasm and normal pulse.       Back:  Neurological: She is alert and oriented to person, place, and time.  Skin: Skin is warm and dry. Capillary refill takes less than 2 seconds.  Psychiatric: She has a normal mood and affect.   Assessment and Plan :   1. Levoscoliosis - Continue Anaprox, Flexeril prn for back pain. Will lift work restrictions except for lifting limit. Follow up in 1 month for recheck.  2. 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  Wallis BambergMario Temisha Murley, PA-C Primary Care at [facility] [phone] 12/28/2016  3:11 PM

## 2016-12-28 NOTE — Patient Instructions (Addendum)
Escoliosis (Scoliosis) Escoliosis es el nombre del trastorno que hace que la columna vertebral se curve hacia los lados. La escoliosis puede deformar los hombros, la cadera, el pecho, la espalda y la caja torcica. CAUSAS La causa de la escoliosis no siempre se conoce. Puede deberse a un defecto de nacimiento o por una enfermedad que provoca una disfuncin muscular y desequilibrio, como parlisis cerebral y distrofia muscular. FACTORES DE RIESGO Tener una enfermedad que cause disfuncin o enfermedad muscular. SIGNOS Y SNTOMAS La escoliosis suele no presentar signos ni sntomas. Si se presentan sntomas, pueden incluir:  Tamao distinto de una parte del cuerpo en comparacin con la otra (asimetra).  Curvatura visible de la columna vertebral.  Dolor. El dolor puede limitar la actividad fsica.  Falta de aire.  Problemas de intestinos o vejiga. DIAGNSTICO Un profesional competente realizar una evaluacin. Esto incluir:  Considerar su historia clnica.  Realizar un examen fsico.  Realizar un examen neurolgico para detectar alguna prdida de la funcin muscular o nerviosa.  Estudios sobre el rango de movimiento en la columna vertebral.  Radiografas. Tambin se puede Information systems manager. TRATAMIENTO El tratamiento vara segn la [place], el grado y la gravedad de la enfermedad. Si la curvatura no es importante, puede necesitar observacin nicamente. Se puede usar un soporte ortopdico para evitar que progrese la escoliosis. Este soporte tambin se puede necesitar durante el estirn puberal. La fisioterapia puede ser beneficiosa. Podra ser necesario que se someta a [place]. INSTRUCCIONES PARA EL CUIDADO EN EL HOGAR  El profesional que lo asiste podr indicarle algunos ejercicios para Physiological scientist. Realcelos como se le indica.  Consulte a su mdico antes de participar en algn deporte.  Si le prescribieron un soporte ortopdico, selo como le  indic su mdico. SOLICITE ATENCIN MDICA SI: El soporte le provoca llagas en la piel (irritacin) o es incmodo. SOLICITE ATENCIN MDICA DE INMEDIATO SI:  Siente dolor en la espalda y no se alivia con los medicamentos recetados.  Siente debilidad o pierde funcionamiento en las piernas.  Pierde control del intestino o de la vejiga. Esta informacin no tiene Theme park manager el consejo del mdico. Asegrese de hacerle al mdico cualquier pregunta que tenga. Document Released: 05/23/2005 Document Revised: 06/03/2013 Document Reviewed: 02/16/2016 Elsevier Interactive Patient Education  2017 Elsevier Inc.    Dicyclomine tablets or capsules Qu es este medicamento? La DICICLOMINA se [employer] tratamiento de problemas intestinales, entre los que se incluye el sndrome del intestino irritable. Este medicamento puede ser utilizado para otros usos; si tiene alguna pregunta consulte con su proveedor de atencin mdica o con su farmacutico. MARCAS COMUNES: Bentyl Qu le debo informar a mi profesional de la salud antes de tomar este medicamento? Necesita saber si usted presenta alguno de los [employer] o situaciones: -dificultad para Geographical information systems officer -problemas de esfago o acidez estomacal -glaucoma -enfermedad cardiaca o ataque cardiaco previo -miastenia gravis -problemas de prstata -obstruccin o infeccin estomacal -colitis ulcerosa -una reaccin alrgica o inusual a la diciclomina, a otros medicamentos, alimentos, colorantes o conservantes -si est embarazada o buscando quedar embarazada -si est amamantando a un beb Cmo debo utilizar este medicamento? Tome este medicamento por va oral con un vaso de agua. Siga las instrucciones de la etiqueta del [place]. Es mejor tomar este medicamento con el estmago vaco, entre 30 minutos y 1 hora antes de las comidas. Tome su medicamento a intervalos regulares. No tome su medicamento con una frecuencia mayor a la  indicada. Hable con su pediatra para  informarse acerca del uso de este medicamento en nios. Puede requerir atencin especial. Aunque este medicamento ha sido recetado a nios tan menores como de 6 meses de edad para condiciones selectivas, las precauciones se aplican. Los pacientes de ms de 65 aos de edad pueden presentar reacciones ms fuertes y Pension scheme managernecesitar dosis Liberty Globalmenores. Sobredosis: Pngase en contacto inmediatamente con un centro toxicolgico o una sala de urgencia si usted cree que haya tomado demasiado medicamento. ATENCIN: Reynolds AmericanEste medicamento es solo para usted. No comparta este medicamento con nadie. Qu sucede si me olvido de una dosis? Si olvida una dosis, tmela lo antes posible. Si es casi la hora de la prxima dosis, tome slo esa dosis. No tome dosis adicionales o dobles. Qu puede interactuar con este medicamento? -amantadina -anticidos -benztropina -digoxina -disopiramida -medicamentos para las Environmental consultantalergias, resfros y dificultades respiratorias -medicamentos para la enfermedad de Alzheimer -medicamentos para la ansiedad o problemas para conciliar el sueo -medicamentos para la diarrea -medicamentos para la depresin mental -medicamentos para problemas mentales y trastornos psicticos -analgsicos -metoclopramida -tegaserod Puede ser que esta lista no menciona todas las posibles interacciones. Informe a su profesional de Beazer Homesla salud de Ingram [employer] Inctodos los productos a base de hierbas, medicamentos de [place] Damventa libre o suplementos nutritivos que est tomando. Si usted fuma, consume bebidas alcohlicas o si utiliza drogas ilegales, indqueselo tambin a su profesional de Beazer Homesla salud. Algunas sustancias pueden interactuar con su medicamento. A qu debo estar atento al usar PPL Corporationeste medicamento? Puede experimentar somnolencia, mareos o visin borrosa. No conduzca ni utilice maquinaria, ni haga nada que Scientist, research (life sciences)le exija permanecer en estado de alerta hasta que sepa cmo le afecta este medicamento. Para reducir el  riesgo de mareos o Penn Estatesdesmayos, no se siente ni se ponga de pie con rapidez, especialmente si es un paciente de edad avanzada. El alcohol puede hacerlo sentir ms somnoliento, evite consumir bebidas alcohlicas. Si sus ojos se vuelven ms sensibles a la luz por el uso de [place] medicamento, mantngase fuera de la luz fuerte y use anteojos para el sol. Evite temperaturas elevadas extremas (piletas de agua caliente, saunas). Este medicamento puede hacer que sude menos que lo normal. La temperatura de su cuerpo puede elevarse a niveles peligrosos, lo que puede causar un golpe de Airline pilotcalor. Los anticidos pueden impedir la accin de PPL Corporationeste medicamento. Si desea tomar un anticido porque tiene Programme researcher, broadcasting/film/videomalestar estomacal, asegrese de que hayan transcurrido por lo menos 1 a 2 horas antes o despus de tomar PPL Corporationeste medicamento. Se le podr secar la boca. Masticar chicle sin azcar, chupar caramelos duros y beber agua en abundancia le ayudar a mantener la boca hmeda. Si el problema no desaparece o es severo, consulte con su mdico. Qu efectos secundarios puedo tener al Boston Scientificutilizar este medicamento? Efectos secundarios que debe informar a su mdico o a Producer, television/film/videosu profesional de la salud tan pronto como sea posible: -agitacin, nerviosismo, confusin -dificultad para tragar -mareos, somnolencia -pulso cardiaco rpido o lento -alucinaciones -dolor o dificultad para orinar Efectos secundarios que, por lo general, no requieren atencin mdica (debe informarlos a su mdico o a su profesional de la salud si persisten o si son molestos): -estreimiento -dolor de cabeza -nuseas o vmito -dificultad sexual Puede ser que esta lista no menciona todos los posibles efectos secundarios. Comunquese a su mdico por asesoramiento mdico Hewlett-Packardsobre los efectos secundarios. Usted puede informar los efectos secundarios a la FDA por telfono al 1-800-FDA-1088. Dnde debo guardar mi medicina? Mantngala fuera del alcance de los nios. Gurdela a  Sanmina-SCItemperatura ambiente, a menos de 30 grados  C (86 grados F). Protjala de la luz. Deseche todo el medicamento que no haya utilizado, despus de la fecha de vencimiento. ATENCIN: Este folleto es un resumen. Puede ser que no cubra toda la posible informacin. Si usted tiene preguntas acerca de esta medicina, consulte con su mdico, su farmacutico o su profesional de Radiographer, therapeutic.  2018 Elsevier/Gold Standard (2014-10-05 00:00:00)     IF you received an x-ray today, you will receive an invoice from [facility] Rad[facility]. Please contact [facility] at [phone] with questions or concerns regarding your invoice.   IF you received labwork today, you will receive an invoice from [place]. Please contact [facility] at [phone] with questions or concerns regarding your invoice.   Our billing staff will not be able to assist you with questions regarding bills from these companies.  You will be contacted with the lab results as soon as they are available. The fastest way to get your results is to activate your My Chart account. Instructions are located on the last page of this paperwork. If you have not heard from [place] regarding the results in 2 weeks, please contact this office.

## 2017-01-09 ENCOUNTER — Encounter: Payer: Self-pay | Admitting: Gynecology

## 2017-05-07 ENCOUNTER — Ambulatory Visit (INDEPENDENT_AMBULATORY_CARE_PROVIDER_SITE_OTHER): Payer: 59

## 2017-05-07 ENCOUNTER — Ambulatory Visit (INDEPENDENT_AMBULATORY_CARE_PROVIDER_SITE_OTHER): Payer: 59 | Admitting: Family Medicine

## 2017-05-07 ENCOUNTER — Encounter: Payer: Self-pay | Admitting: Family Medicine

## 2017-05-07 VITALS — BP 132/91 | HR 85 | Temp 98.2°F | Resp 16 | Ht 65.0 in | Wt 138.0 lb

## 2017-05-07 DIAGNOSIS — F411 Generalized anxiety disorder: Secondary | ICD-10-CM | POA: Diagnosis not present

## 2017-05-07 DIAGNOSIS — M7989 Other specified soft tissue disorders: Secondary | ICD-10-CM

## 2017-05-07 DIAGNOSIS — Z789 Other specified health status: Secondary | ICD-10-CM

## 2017-05-07 DIAGNOSIS — F439 Reaction to severe stress, unspecified: Secondary | ICD-10-CM

## 2017-05-07 DIAGNOSIS — R55 Syncope and collapse: Secondary | ICD-10-CM | POA: Diagnosis not present

## 2017-05-07 DIAGNOSIS — R079 Chest pain, unspecified: Secondary | ICD-10-CM | POA: Diagnosis not present

## 2017-05-07 DIAGNOSIS — D72829 Elevated white blood cell count, unspecified: Secondary | ICD-10-CM

## 2017-05-07 DIAGNOSIS — M25562 Pain in left knee: Secondary | ICD-10-CM

## 2017-05-07 LAB — GLUCOSE, POCT (MANUAL RESULT ENTRY): POC Glucose: 96 mg/dl (ref 70–99)

## 2017-05-07 LAB — POCT CBC
GRANULOCYTE PERCENT: 73 % (ref 37–80)
HCT, POC: 43.2 % (ref 37.7–47.9)
HEMOGLOBIN: 14.2 g/dL (ref 12.2–16.2)
LYMPH, POC: 2.4 (ref 0.6–3.4)
MCH, POC: 30.9 pg (ref 27–31.2)
MCHC: 32.9 g/dL (ref 31.8–35.4)
MCV: 94 fL (ref 80–97)
MID (cbc): 0.7 (ref 0–0.9)
MPV: 10.1 fL (ref 0–99.8)
PLATELET COUNT, POC: 239 10*3/uL (ref 142–424)
POC GRANULOCYTE: 8.2 — AB (ref 2–6.9)
POC LYMPH %: 21 % (ref 10–50)
POC MID %: 6 %M (ref 0–12)
RBC: 4.59 M/uL (ref 4.04–5.48)
RDW, POC: 12.1 %
WBC: 11.2 10*3/uL — AB (ref 4.6–10.2)

## 2017-05-07 MED ORDER — HYDROXYZINE HCL 10 MG PO TABS: 10.0000 mg | ORAL_TABLET | Freq: Three times a day (TID) | ORAL | 0 refills | Status: DC | PRN

## 2017-05-07 MED ORDER — FLUOXETINE HCL 20 MG PO TABS: 20.0000 mg | ORAL_TABLET | Freq: Every day | ORAL | 1 refills | Status: AC

## 2017-05-07 NOTE — Progress Notes (Signed)
Subjective:    Patient ID: Teresa Hurst, female    DOB: 1986-04-14, 31 y.o.   MRN: [id number]  HPI Cyndie Woodbeck is a 31 y.o. female Presents today for: Chief Complaint  Patient presents with  . Leg Pain    x3 days no injury located under knee cap and behind knee radiating to back  . Anxiety    unable to sleep, wakes up scared    Language barrier - interpreter# [id number]  L leg pain: Started 2- 3 days ago, NKI. No known prior similar sx's. States that the pain is on her entire left side of her body. Notes pain from left leg spreads up the left side of her body with walking. No weakness, but having some headaches. 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 Process clothes, sorting, and takes merchandise to floor. No change in type of work. Last night felt bad when lying in bed - sat up and vision got blurry - lay down on the ground as felt like she was going to pass out. Felt cold chills and sweats.  Drank some juice. Has had chest pain, headache, generalized weakness. R sided headache.  No recent prolonged car travel or air travel, no recent calf pain or swelling.  Has issues with anxiety, gets nervous at times. Feels like losing control with meals - feels like she has to eat continuously. Has not discussed these sx's with primary provider. Has had long term anxiety, but feels like it has been worsening.  Feels like anxiety worsened past year. Denies suicidal thoughts, homicidal ideation, or hallucinations. Does have some recurrent worries and thoughts. No prior counseling or meds for anxiety.   Wt Readings from Last 3 Encounters:  05/07/17 138 lb (62.6 kg)  12/28/16 136 lb 6.4 oz (61.9 kg)  12/07/16 136 lb (61.7 kg)    Review of Systems  Constitutional: Positive for chills. Negative for fever.  Cardiovascular: Negative for chest pain.  Musculoskeletal: Positive for arthralgias.  Neurological: Positive for headaches.    Psychiatric/Behavioral: Positive for sleep disturbance. The patient is nervous/anxious.        Objective:   Physical Exam  Constitutional: She is oriented to person, place, and time. She appears well-developed and well-nourished.  HENT:  Head: Normocephalic and atraumatic.  Eyes: Pupils are equal, round, and reactive to light. Conjunctivae and EOM are normal.  Neck: Carotid bruit is not present.  Cardiovascular: Normal rate, regular rhythm, normal heart sounds and intact distal pulses.   Pulmonary/Chest: Effort normal and breath sounds normal.  Reproducible upper left chest wall pain.  Abdominal: Soft. She exhibits no pulsatile midline mass. There is no tenderness.  Musculoskeletal:       Left knee: She exhibits swelling. She exhibits normal range of motion, no effusion, no ecchymosis, no deformity, no laceration, no erythema and normal alignment. Tenderness found.       Legs: Negative straight leg raise, equal strength of upper and lower 70s bilaterally.  Neurological: She is alert and oriented to person, place, and time. She has normal strength. No cranial nerve deficit or sensory deficit. She displays a negative Romberg sign. GCS eye subscore is 4. GCS verbal subscore is 5. GCS motor subscore is 6.  No pronator drift, nonfocal neurologic exam.  Skin: Skin is warm and dry.  Psychiatric: Her speech is normal and behavior is normal. Thought content normal. Her mood appears anxious (Tearful at times during the exam.).  Vitals reviewed.  Results  for orders placed or performed in visit on 05/07/17  POCT glucose (manual entry)  Result Value Ref Range   POC Glucose 96 70 - 99 mg/dl  POCT CBC  Result Value Ref Range   WBC 11.2 (A) 4.6 - 10.2 K/uL   Lymph, poc 2.4 0.6 - 3.4   POC LYMPH PERCENT 21.0 10 - 50 %L   MID (cbc) 0.7 0 - 0.9   POC MID % 6.0 0 - 12 %M   POC Granulocyte 8.2 (A) 2 - 6.9   Granulocyte percent 73.0 37 - 80 %G   RBC 4.59 4.04 - 5.48 M/uL   Hemoglobin 14.2 12.2 -  16.2 g/dL   HCT, POC 09.843.2 11.937.7 - 47.9 %   MCV 94.0 80 - 97 fL   MCH, POC 30.9 27 - 31.2 pg   MCHC 32.9 31.8 - 35.4 g/dL   RDW, POC 14.712.1 %   Platelet Count, POC 239 142 - 424 K/uL   MPV 10.1 0 - 99.8 fL   EKG sinus rhythm, no acute findings.     Assessment & Plan:   Teresa MagicValeria Quintela is a 31 y.o. female Near syncope - Plan: POCT glucose (manual entry), POCT CBC, EKG 12-Lead  -Nonfocal neuro exam, reassuring CBC, EKG, glucose. May be related to anxiety symptoms, possible anxiety attack/panic attack. Advised to make sure she contains fluid intake, and if symptoms return, return to office or ER.  Acute pain of left knee - Plan: DG Knee Complete 4 Views Left Left leg swelling - Plan: DG Knee Complete 4 Views Left  -Slight soft tissue swelling inferior to knee, no concerning findings on the x-ray. Possible contusion, but otherwise no known injury. Symptomatic care discussed with Tylenol over-the-counter, with recheck in 1 week, sooner if worse.  Generalized anxiety disorder - Plan: FLUoxetine (PROZAC) 20 MG tablet, hydrOXYzine (ATARAX/VISTARIL) 10 MG tablet Situational stress  -Generalized anxiety by description with worsening symptoms recently. Start fluoxetine 20 mg daily, hydroxyzine 10 mg 3 times a day when necessary, potential side effects discussed, recheck within the next 1 week.   Chest pain, unspecified type - Plan: EKG 12-Lead, DG Chest 2 View  -Reproducible on exam, reassuring chest x-ray and EKG. May also be related to anxiety symptoms as above, as well as muscular/chest wall pain. Tylenol as above for leg, RTC precautions of persistent/ER precautions if worsening.  Leukocytosis, unspecified type - Plan: DG Chest 2 View  - Borderline. Afebrile, no apparent locus of infection, RTC precautions discussed if any fever or acute illness symptoms.  Language barrier  -Video interpreter used as well as Spanish spoken with understanding expressed   Meds ordered this encounter    Medications  . FLUoxetine (PROZAC) 20 MG tablet    Sig: Take 1 tablet (20 mg total) by mouth daily.    Dispense:  30 tablet    Refill:  1  . hydrOXYzine (ATARAX/VISTARIL) 10 MG tablet    Sig: Take 1 tablet (10 mg total) by mouth 3 (three) times daily as needed.    Dispense:  30 tablet    Refill:  0   Patient Instructions   por ansiedad, empiece fluoxetine cada dia, y si necesario hydroxyzine 3 veces cada dia si necesario. empiece medicina es necesario habla con therapista. llame de telefono:  Tylenol por su pierna. regrese en 1 semana, mas temprano si empeorse.   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 Interfiere en las funciones vitales, incluyendo  las [place], el trabajo y la escuela.  Es diferente de la ansiedad normal que todas las personas experimentan en algn momento de su vida en respuesta a sucesos y Chief Operating Officer. En verdad, la ansiedad normal nos ayuda a prepararnos y Human resources officer acontecimientos y actividades de la vida. La ansiedad normal desaparece despus de que el evento o la actividad ha finalizado.  El trastorno de ansiedad generalizada no est necesariamente relacionada con eventos o actividades especficas. Tambin causa un exceso de ansiedad en proporcin a sucesos o actividades especficas. En este trastorno la ansiedad es difcil de Chief Operating Officer. Los sntomas pueden variar de leves a muy graves. Las personas que sufren de trastorno de ansiedad generalizada pueden tener intensas olas de ansiedad con sntomas fsicos (ataques de pnico).  SNTOMAS  La ansiedad y la preocupacin asociada a este trastorno son difciles de Chief Operating Officer. Esta ansiedad y la preocupacin estn relacionados con muchos eventos de la vida y sus actividades y tambin ocurre durante ms [employer] que no ocurre, durante 6 meses o ms. Las personas que la sufren pueden tener tres o ms de los siguientes  sntomas (uno o ms en los nios):   Glass blower/designer.  Dificultades de concentracin.   Irritabilidad.  Tensin muscular  Dificultad para dormirse o sueo poco satisfactorio. DIAGNSTICO  Se diagnostica a travs de una evaluacin realizada por el mdico. El mdico le har preguntas acerca de su estado de nimo, sntomas fsicos y sucesos de [place] vida. Le har preguntas sobre su historia clnica, el consumo de alcohol o drogas, incluyendo los medicamentos recetados. [employer] un examen fsico e indicar anlisis de [place]. Ciertas enfermedades y el uso de determinadas sustancias pueden causar sntomas similares a este trastorno. Su mdico lo puede derivar a Music therapist en salud mental para una evaluacin ms profunda.Gerlean Ren  Las terapias siguientes se utilizan en el tratamiento de este trastorno:   Medicamentos - Se recetan antidepresivos para el control diario a Air cabin crew. Pueden indicarse tambin medicamentos para combatir la [employer] graves, especialmente cuando ocurren ataques de pnico.   Terapia conversada (psicoterapia) Ciertos tipos de psicoterapia pueden ser tiles en el tratamiento del trastorno de ansiedad generalizada, proporcionando apoyo, educacin y Optometrist. Una forma de psicoterapia llamada terapia cognitivo-conductual puede ensearle formas saludables de pensar y Publishing rights manager a los eventos y actividades de la vida diaria.  Tcnicasde manejo del estrs- Estas tcnicas incluyen el yoga, la meditacin y el ejercicio y pueden ser muy tiles cuando se practican con regularidad. Un especialista en salud mental puede ayudar a determinar qu tratamiento es mejor para usted. Algunas personas obtienen mejora con una terapia. Sin embargo, Economist requieren una combinacin de terapias.  Esta informacin no tiene Theme park manager el consejo del mdico. Asegrese de hacerle al mdico cualquier pregunta que tenga. Document Released:  12/08/2012 Document Revised: 09/03/2014 Elsevier Interactive Patient Education  2017 [employer].    IF you received an x-ray today, you will receive an invoice from [facility] Radiology. Please contact [facility] Radiology at [phone] with questions or concerns regarding your invoice.   IF you received labwork today, you will receive an invoice from [place]. Please contact [facility] at [phone] with questions or concerns regarding your invoice.   Our billing staff will not be able to assist you with questions regarding bills from these companies.  You will be contacted with the lab results as soon as they are available. The fastest way to get your results is to  activate your My Chart account. Instructions are located on the last page of this paperwork. If you have not heard from [place] regarding the results in 2 weeks, please contact this office.      Signed,   Meredith Staggers, MD Primary Care at [facility].  05/10/17 5:44 PM

## 2017-05-07 NOTE — Patient Instructions (Addendum)
por ansiedad, empiece fluoxetine cada dia, y si necesario hydroxyzine 3 veces cada dia si necesario. empiece medicina es necesario habla con therapista. llame de telefono:  Tylenol por su pierna. regrese en 1 semana, mas temprano si empeorse.   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 Interfiere en las funciones vitales, incluyendo las [place]aciones, el trabajo y la escuela.  Es diferente de la ansiedad normal que todas las personas experimentan en algn momento de su vida en respuesta a sucesos y Chief Operating Officeractividades especficas. En verdad, la ansiedad normal nos ayuda a prepararnos y Human resources officeratravesar estos acontecimientos y actividades de la vida. La ansiedad normal desaparece despus de que el evento o la actividad ha finalizado.  El trastorno de ansiedad generalizada no est necesariamente relacionada con eventos o actividades especficas. Tambin causa un exceso de ansiedad en proporcin a sucesos o actividades especficas. En este trastorno la ansiedad es difcil de Chief Operating Officercontrolar. Los sntomas pueden variar de leves a muy graves. Las personas que sufren de trastorno de ansiedad generalizada pueden tener intensas olas de ansiedad con sntomas fsicos (ataques de pnico).  SNTOMAS  La ansiedad y la preocupacin asociada a este trastorno son difciles de Chief Operating Officercontrolar. Esta ansiedad y la preocupacin estn relacionados con muchos eventos de la vida y sus actividades y tambin ocurre durante ms [employer] de los que no ocurre, durante 6 meses o ms. Las personas que la sufren pueden tener tres o ms de los siguientes sntomas (uno o ms en los nios):   Glass blower/designerAgitacin   Fatiga.  Dificultades de concentracin.   Irritabilidad.  Tensin muscular  Dificultad para dormirse o sueo poco satisfactorio. DIAGNSTICO  Se diagnostica a travs de una evaluacin realizada por el mdico. El mdico le har preguntas acerca de su estado de nimo,  sntomas fsicos y sucesos de Oregonsu vida. Le har preguntas sobre su historia clnica, el consumo de alcohol o drogas, incluyendo los medicamentos recetados. Nucor Corporationambin le har un examen fsico e indicar anlisis de [place]angre. Ciertas enfermedades y el uso de determinadas sustancias pueden causar sntomas similares a este trastorno. Su mdico lo puede derivar a Music therapistun especialista en salud mental para una evaluacin ms profunda.Gerlean Ren.  TRATAMIENTO  Las terapias siguientes se utilizan en el tratamiento de este trastorno:   Medicamentos - Se recetan antidepresivos para el control diario a Air cabin crewlargo plazo. Pueden indicarse tambin medicamentos para combatir la Cox Communicationsansiedad en los casos graves, especialmente cuando ocurren ataques de pnico.   Terapia conversada (psicoterapia) Ciertos tipos de psicoterapia pueden ser tiles en el tratamiento del trastorno de ansiedad generalizada, proporcionando apoyo, educacin y Optometristorientacin. Una forma de psicoterapia llamada terapia cognitivo-conductual puede ensearle formas saludables de pensar y Publishing rights managerreaccionar a los eventos y actividades de la vida diaria.  Tcnicasde manejo del estrs- Estas tcnicas incluyen el yoga, la meditacin y el ejercicio y pueden ser muy tiles cuando se practican con regularidad. Un especialista en salud mental puede ayudar a determinar qu tratamiento es mejor para usted. Algunas personas obtienen mejora con una terapia. Sin embargo, Economistotras personas requieren una combinacin de terapias.  Esta informacin no tiene Theme park managercomo fin reemplazar el consejo del mdico. Asegrese de hacerle al mdico cualquier pregunta que tenga. Document Released: 12/08/2012 Document Revised: 09/03/2014 Elsevier Interactive Patient Education  2017 [employer]    IF you received an x-ray today, you will [facility] an [facility]. [facility] at [phone] with questions or concerns regarding your invoice.   IF you received labwork today,  you  will receive an invoice from [facility]. Please contact [facility] at [phone] with questions or concerns regarding your invoice.   Our billing staff will not be able to assist you with questions regarding bills from these companies.  You will be contacted with the lab results as soon as they are available. The fastest way to get your results is to activate your My Chart account. Instructions are located on the last page of this paperwork. If you have not heard from [place] regarding the results in 2 weeks, please contact this office.

## 2017-05-14 ENCOUNTER — Ambulatory Visit: Payer: 59 | Admitting: Family Medicine

## 2017-05-15 ENCOUNTER — Encounter: Payer: Self-pay | Admitting: Urgent Care

## 2017-05-15 ENCOUNTER — Ambulatory Visit (INDEPENDENT_AMBULATORY_CARE_PROVIDER_SITE_OTHER): Payer: 59 | Admitting: Urgent Care

## 2017-05-15 VITALS — BP 117/72 | HR 75 | Temp 98.4°F | Resp 17 | Ht 65.0 in | Wt 138.0 lb

## 2017-05-15 DIAGNOSIS — M79662 Pain in left lower leg: Secondary | ICD-10-CM

## 2017-05-15 DIAGNOSIS — R55 Syncope and collapse: Secondary | ICD-10-CM

## 2017-05-15 DIAGNOSIS — G4700 Insomnia, unspecified: Secondary | ICD-10-CM | POA: Diagnosis not present

## 2017-05-15 DIAGNOSIS — F419 Anxiety disorder, unspecified: Secondary | ICD-10-CM

## 2017-05-15 DIAGNOSIS — R102 Pelvic and perineal pain: Secondary | ICD-10-CM

## 2017-05-15 DIAGNOSIS — D72829 Elevated white blood cell count, unspecified: Secondary | ICD-10-CM | POA: Diagnosis not present

## 2017-05-15 LAB — POCT URINALYSIS DIP (MANUAL ENTRY)
BILIRUBIN UA: NEGATIVE
Glucose, UA: NEGATIVE mg/dL
Ketones, POC UA: NEGATIVE mg/dL
LEUKOCYTES UA: NEGATIVE
NITRITE UA: NEGATIVE
PH UA: 6 (ref 5.0–8.0)
PROTEIN UA: NEGATIVE mg/dL
Spec Grav, UA: 1.02 (ref 1.010–1.025)
UROBILINOGEN UA: 0.2 U/dL

## 2017-05-15 MED ORDER — HYDROXYZINE HCL 50 MG PO TABS: 50.0000 mg | ORAL_TABLET | Freq: Every day | ORAL | 0 refills | Status: AC | PRN

## 2017-05-15 MED ORDER — TRAZODONE HCL 50 MG PO TABS: 25.0000 mg | ORAL_TABLET | Freq: Every evening | ORAL | 3 refills | Status: AC | PRN

## 2017-05-15 NOTE — Patient Instructions (Addendum)
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 Interfiere en las funciones vitales, incluyendo las [place], el trabajo y la escuela.  Es diferente de la ansiedad normal que todas las personas experimentan en algn momento de su vida en respuesta a sucesos y Chief Operating Officer. En verdad, la ansiedad normal nos ayuda a prepararnos y Human resources officer acontecimientos y actividades de la vida. La ansiedad normal desaparece despus de que el evento o la actividad ha finalizado.  El trastorno de ansiedad generalizada no est necesariamente relacionada con eventos o actividades especficas. Tambin causa un exceso de ansiedad en proporcin a sucesos o actividades especficas. En este trastorno la ansiedad es difcil de Chief Operating Officer. Los sntomas pueden variar de leves a muy graves. Las personas que sufren de trastorno de ansiedad generalizada pueden tener intensas olas de ansiedad con sntomas fsicos (ataques de pnico).  SNTOMAS  La ansiedad y la preocupacin asociada a este trastorno son difciles de Chief Operating Officer. Esta ansiedad y la preocupacin estn relacionados con muchos eventos de la vida y sus actividades y tambin ocurre durante ms [employer] que no ocurre, durante 6 meses o ms. Las personas que la sufren pueden tener tres o ms de los siguientes sntomas (uno o ms en los nios):   Glass blower/designer.  Dificultades de concentracin.   Irritabilidad.  Tensin muscular  Dificultad para dormirse o sueo poco satisfactorio. DIAGNSTICO  Se diagnostica a travs de una evaluacin realizada por el mdico. El mdico le har preguntas acerca de su estado de nimo, sntomas fsicos y sucesos de [place] vida. Le har preguntas sobre su historia clnica, el consumo de alcohol o drogas, incluyendo los medicamentos recetados. [employer] un examen fsico e indicar anlisis de [place]. Ciertas enfermedades y el uso de  determinadas sustancias pueden causar sntomas similares a este trastorno. Su mdico lo puede derivar a Music therapist en salud mental para una evaluacin ms profunda.Teresa Hurst  Las terapias siguientes se utilizan en el tratamiento de este trastorno:   Medicamentos - Se recetan antidepresivos para el control diario a Air cabin crew. Pueden indicarse tambin medicamentos para combatir la [employer] graves, especialmente cuando ocurren ataques de pnico.   Terapia conversada (psicoterapia) Ciertos tipos de psicoterapia pueden ser tiles en el tratamiento del trastorno de ansiedad generalizada, proporcionando apoyo, educacin y Optometrist. Una forma de psicoterapia llamada terapia cognitivo-conductual puede ensearle formas saludables de pensar y Publishing rights manager a los eventos y actividades de la vida diaria.  Tcnicasde manejo del estrs- Estas tcnicas incluyen el yoga, la meditacin y el ejercicio y pueden ser muy tiles cuando se practican con regularidad. Un especialista en salud mental puede ayudar a determinar qu tratamiento es mejor para usted. Algunas personas obtienen mejora con una terapia. Sin embargo, Economist requieren una combinacin de terapias.  Esta informacin no tiene Theme park manager el consejo del mdico. Asegrese de hacerle al mdico cualquier pregunta que tenga. Document Released: 12/08/2012 Document Revised: 09/03/2014 Elsevier Interactive Patient Education  2017 [employer].    Fluoxetine capsules or tablets (PMDD indication) Qu es este medicamento? La FLUOXETINA pertenece a un grupo de medicamentos llamados inhibidores selectivos de la recaptacin de serotonina (ISRS). Se utiliza para el trastorno disfrico premenstrual (TDPM). El TDPM provoca sntomas intensos de [place] de nimo o fsicos una a [employer] antes de su periodo cada mes. Este medicamento ayuda a mejorar cambios de [place] de nimo, cansancio, tensin y sensibilidad de las  [place]. [employer]  medicamento puede ser utilizado para otros usos; si tiene alguna pregunta consulte con su proveedor de atencin mdica o con su farmacutico. MARCAS COMUNES: Prozac, Sarafem, Selfemra Qu le debo informar a mi profesional de la salud antes de tomar este medicamento? Necesitan saber si usted presenta alguno de los siguientes problemas o situaciones: trastorno bipolar o antecedentes familiares de trastorno bipolar trastornos de sangrado glaucoma enfermedad cardiaca enfermedad heptica bajos niveles de sodio en la sangre convulsiones ideas, planes o intento de suicidio; si usted o alguien de su familia ha intentado suicidarse previamente si toma IMAO, tales como Carbex, Eldepryl, Marplan, Nardil y Parnate si toma medicamentos que tratan o previenen cogulos sanguneos enfermedad tiroidea una reaccin alrgica o inusual a la fluoxetina, a otros medicamentos, alimentos, colorantes o conservantes si est embarazada o buscando quedar embarazada si est amamantando a un beb trastorno bipolar o antecedentes familiares de trastorno bipolar trastornos de sangrado glaucoma enfermedad cardiaca enfermedad heptica bajos niveles de sodio en la sangre convulsiones ideas, planes o intento de suicidio; si usted o alguien de su familia ha intentado suicidarse previamente si toma IMAO, tales como Carbex, Eldepryl, Marplan, Nardil y Parnate si toma medicamentos que tratan o previenen cogulos sanguneos enfermedad tiroidea una reaccin alrgica o inusual a la fluoxetina, a otros medicamentos, alimentos, colorantes o conservantes si est embarazada o buscando quedar embarazada si est amamantando a un beb Cmo debo utilizar este medicamento? Tome este medicamento por va oral con un vaso de agua. Siga las instrucciones de la etiqueta del [place]. Puede tomarlo con o sin alimentos. Tome su medicamento a intervalos regulares. No lo tome con una frecuencia mayor a la indicada. No deje de tomar [employer] de  repente a menos que as lo indique su mdico. Dejar de Visual merchandiser medicamento demasiado rpido puede causar efectos secundarios graves o podra empeorar su afeccin. Su farmacutico le dar una Gua del medicamento especial (MedGuide, nombre en ingls) con cada receta y en cada ocasin que la vuelva a surtir. Asegrese de leer esta informacin cada vez cuidadosamente. Hable con su pediatra para informarse acerca del uso de este medicamento en nios. Puede requerir atencin especial. Sobredosis: Pngase en contacto inmediatamente con un centro toxicolgico o una sala de urgencia si usted cree que haya tomado demasiado medicamento. ATENCIN: [employer] es solo para usted. No comparta este medicamento con nadie. Qu sucede si me olvido de una dosis? Si olvida una dosis, sltese la dosis [place] y vuelva al horario habitual de sus dosis. No tome dosis adicionales o dobles. Qu puede interactuar con este medicamento? No tome este medicamento con ninguno de los siguientes frmacos: otros medicamentos que contengan fluoxetina, tales como Prozac o Symbyax cisaprida linezolida IMAO, tales como Carbex, Eldepryl, Marplan, Nardil y Parnate azul de metileno (inyectado en una vena) pimozida tioridazina Este medicamento tambin puede Product/process development scientist con los siguientes medicamentos: alcohol anfetaminas aspirina y medicamentos tipo aspirina carbamazepina ciertos medicamentos para la depresin, ansiedad o trastornos psicticos ciertos medicamentos para la migraa, tales como almotriptn, eletriptn, frovatriptn, naratriptn, rizatriptn, sumatriptn y zolmitriptn digoxina diurticos fentanilo flecainida furazolidona isoniazida litio medicamentos para conciliar el sueo medicamentos que tratan o previenen cogulos sanguneos, como warfarina, enoxaparina y dalteparina [place], medicamentos para Chief Technology Officer y la inflamacin, como ibuprofeno o naproxeno fenitona procarbazina propafenona rasagilina ritonavir suplementos  tales como hierba de [place], kava kava y valeriana tramadol triptfano vinblastina Puede ser que esta lista no menciona todas las posibles interacciones. Informe a su profesional de la salud de [employer] productos a base de  hierbas, medicamentos de venta libre o suplementos nutritivos que est tomando. Si usted fuma, consume bebidas alcohlicas o si utiliza drogas ilegales, indqueselo tambin a su profesional de [employer]. Algunas sustancias pueden interactuar con su medicamento. A qu debo estar atento al usar [employer]? Informe a su mdico si sus sntomas no mejoran o si empeoran. Visite a su mdico o a su profesional de la salud para chequear su evolucin peridicamente. Debido que puede ser necesario tomar este medicamento durante varias semanas para que sea posible observar sus efectos en forma [place], es importante que sigue su tratamiento como recetado por su mdico. Los pacientes y sus familias deben estar atentos si empeora la depresin o ideas suicidas. Tambin est atento a cambios repentinos o severos de emocin, tales como el sentirse ansioso, agitado, lleno de pnico, irritable, hostil, agresivo, impulsivo, inquietud severa, demasiado excitado y hiperactivo o dificultad para conciliar el sueo. Si esto ocurre, especialmente al comenzar con el tratamiento o al cambiar de dosis, comunquese con su mdico. Puede experimentar somnolencia o [employer]. No conduzca ni utilice maquinaria, ni haga nada que Scientist, research (life sciences) en estado de alerta hasta que sepa cmo le afecta este medicamento. No se siente ni se ponga de pie con rapidez, especialmente si es un paciente de edad avanzada. Esto reduce el riesgo de mareos o [employer]. El alcohol puede interferir con el efecto de [place]. Evite consumir bebidas alcohlicas. Se le podr secar la boca. Masticar chicle sin azcar, chupar caramelos duros y tomar agua en abundancia le ayudar a mantener la boca hmeda. Si el problema no desaparece o  es severo, consulte a su mdico. Este medicamento puede afectar sus niveles de [place]. Si tiene diabetes, consulte con su mdico o profesional de la salud antes de cambiar su dieta o la dosis de su medicamento para la diabetes. Qu efectos secundarios puedo tener al [employer] este medicamento? Efectos secundarios que debe informar a su mdico o a Producer, television/film/video de la salud tan pronto como sea posible: Therapist, art, como erupcin cutnea, comezn/picazn o urticarias, e hinchazn de la cara, los labios o la lengua ansiedad heces de color negro y aspecto alquitranado problemas respiratorios cambios en la visin confusin estado de nimo elevado, menor necesidad de dormir, pensamientos acelerados, conducta impulsiva dolor ocular ritmo cardiaco rpido, irregular sensacin de desmayos o aturdimiento, cadas sensacin de agitacin, enojo o irritabilidad alucinaciones, prdida del contacto con la realidad prdida de equilibrio o coordinacin prdida de memoria inquietud, caminar de un lado a otro, incapacidad para quedarse quieto convulsiones rigidez de los [employer] ideas suicidas u otros cambios en el estado de nimo dificultad para conciliar el sueo sangrado o moretones inusuales cansancio o debilidad inusual vmito Efectos secundarios que generalmente no requieren atencin mdica (infrmelos a su mdico o a Producer, television/film/video de la salud si persisten o si son molestos): cambios en el apetito o el peso cambios en el deseo o desempeo sexual diarrea boca seca dolor de cabeza aumento de la sudoracin indigestin, nuseas temblores Puede ser que esta lista no menciona todos los posibles efectos secundarios. Comunquese a su mdico por asesoramiento mdico [employer]. Usted puede informar los efectos secundarios a la FDA por telfono al 1-800-FDA-1088. Dnde debo guardar mi medicina? Mantngala fuera del alcance de los nios. Gurdela a [employer], entre 15 y 30  grados C (69 y 68 grados F). Deseche todo el medicamento que no haya utilizado, despus de la fecha de vencimiento. ATENCIN: Este folleto es un  resumen. Puede ser que no cubra toda la posible informacin. Si usted tiene preguntas acerca de esta medicina, consulte con su mdico, su farmacutico o su profesional de Radiographer, therapeutic.  2018 Elsevier/Gold Standard (2016-09-13 00:00:00)   Trazodone tablets Qu es este medicamento? La TRAZODONA se utiliza para tratar la depresin. Este medicamento puede ser utilizado para otros usos; si tiene alguna pregunta consulte con su proveedor de atencin mdica o con su farmacutico. MARCAS COMUNES: Desyrel Qu le debo informar a mi profesional de la salud antes de tomar este medicamento? Necesita saber si usted presenta alguno de los siguientes problemas o situaciones: -intento de suicidio o con ideas suicidas -trastorno bipolar -problemas sanguneos -glaucoma -enfermedad cardiaca o ataque cardiaco previo -latidos cardiacos irregulares -enfermedad renal o heptica -niveles bajos de sodio en la sangre -una reaccin alrgica o inusual a la trazodona, a otros medicamentos, alimentos, colorantes o conservantes -si est embarazada o buscando quedar embarazada -si est amamantando a un beb Cmo debo utilizar este medicamento? Tome este medicamento por va oral con un vaso de agua. Siga las instrucciones de la etiqueta del [place]. [employer] medicamento poco tiempo despus de una comida o un refrigerio ligero. Tome su medicamento a intervalos regulares. No tome su medicamento con una frecuencia mayor a la indicada. No deje de tomar [employer] de repente a menos que as lo indique su mdico. Dejar de Visual merchandiser medicamento demasiado rpido puede causar efectos secundarios graves o podra empeorar su afeccin. Su farmacutico le dar una Gua del medicamento especial (MedGuide, nombre en ingls) con cada receta y en cada ocasin que la vuelva a surtir.  Asegrese de leer esta informacin cada vez cuidadosamente. Hable con su pediatra para informarse acerca del uso de este medicamento en nios. Puede requerir atencin especial. Sobredosis: Pngase en contacto inmediatamente con un centro toxicolgico o una sala de urgencia si usted cree que haya tomado demasiado medicamento. ATENCIN: [employer] es solo para usted. No comparta este medicamento con nadie. Qu sucede si me olvido de una dosis? Si olvida una dosis, tmela lo antes posible. Si es casi la hora de la prxima dosis, tome slo esa dosis. No tome dosis adicionales o dobles. Qu puede interactuar con este medicamento? No tome esta medicina con ninguno de los siguientes medicamentos: ciertos medicamentos para infecciones micticas, tales como fluconazol, quetoconazol, itraconazol, posaconazol, voriconazol cisapride dofetilida dronedarona linezolid IMAOs, tales como Carbex, Eldepryl, Marplan, Nardil y Parnate mesoridazina azul de metileno (va intravenosa) pimozida saquinavir tioridazina ziprasidona Esta medicina tambin puede interactuar con los siguientes medicamentos: alcohol medicamentos antivricos para el VIH o SIDA aspirina o medicamentos tipo aspirina barbitricos tales como el fenobarbital ciertos medicamentos para la presin sangunea, enfermedad cardiaca, pulso cardiaco irregular ciertos medicamentos para la depresin, ansiedad o trastornos psicticos ciertos medicamentos para las migraas, tales como almotriptn, eletriptn, frovatriptn, naratriptn, rizatriptn, sumatriptn, zolmitriptn ciertos medicamentos para convulsiones, tales como carbamazepina y fenitona ciertos medicamento para conciliar el sueo ciertos medicamentos que tratan o previenen cogulos sanguneos, como dalteparina, enoxaparina, warfarina digoxina fentanilo litio los [place], medicamentos para el dolor o inflamacin, como ibuprofeno o naproxeno otros medicamentos que prolongan el intervalo QT (causa un ritmo  cardiaco anormal) rasagilina medicamentos a base de hierbas que contienen kava kava, hierba de [place] o valeriana tramadol triptfano Puede ser que esta lista no menciona todas las posibles interacciones. Informe a su profesional de [employer] de [employer] productos a base de hierbas, medicamentos de [place] o suplementos nutritivos que est tomando. Si usted fuma, consume bebidas  alcohlicas o si utiliza drogas ilegales, indqueselo tambin a su profesional de [employer]. Algunas sustancias pueden interactuar con su medicamento. A qu debo estar atento al usar [employer]? Informe a su mdico si sus sntomas no mejoran o si empeoran. Visite a su mdico o a su profesional de la salud para chequear su evolucin peridicamente. Debido que puede ser necesario tomar este medicamento durante varias semanas para que sea posible observar sus efectos en forma [place], es importante que sigue su tratamiento como recetado por su mdico. Los pacientes y sus familias deben estar atentos si empeora la depresin o ideas suicidas. Tambin est atento a cambios repentinos o severos de emocin, tales como el sentirse ansioso, agitado, lleno de pnico, irritable, hostil, agresivo, impulsivo, inquietud severa, demasiado excitado y hiperactivo o dificultad para conciliar el sueo. Si esto ocurre, especialmente al comenzar con el tratamiento o al cambiar de dosis, comunquese con su profesional de [employer]. Puede experimentar somnolencia, mareos o visin borrosa. No conduzca ni utilice maquinaria, ni haga nada que Scientist, research (life sciences) en estado de alerta hasta que sepa cmo le afecta este medicamento. No se siente ni se ponga de pie con rapidez, especialmente si es un paciente de edad avanzada. Esto reduce el riesgo de mareos o [employer]. El alcohol puede interferir con el efecto de [place]. Evite consumir bebidas alcohlicas. Este medicamento puede provocar sequedad de los ojos y visin borrosa. Su [place] lentes de  contacto, puede sentir [facility]. Las gotas lubricantes pueden ayudarle. Si el problema no desaparece o es severo, visite a su mdico de ojos. Este medicamento puede secarle la boca. El Product manager chicle sin azcar, chupar caramelos duros y tomar agua en abundancia le ayudarn a mantener la boca hmeda. Si el problema no desaparece o es severo, consulte a su mdico. Qu efectos secundarios puedo tener al [employer] este medicamento? Efectos secundarios que debe informar a su mdico o a Producer, television/film/video de la salud tan pronto como sea posible: Therapist, art, como erupcin cutnea, comezn/picazn o urticarias, e hinchazn de la cara, los labios o la lengua estado de nimo elevado, menor necesidad de dormir, pensamientos acelerados, conducta impulsiva confusin ritmo cardiaco rpido, irregular sensacin de desmayos o aturdimiento, cadas sensacin de agitacin, enojo o irritabilidad prdida de equilibrio o coordinacin ereccin dolorosa o prolongada inquietud, caminar de un lado a otro, incapacidad para quedarse quieto ideas suicidas u otros cambios en el estado de nimo temblores dificultad para conciliar el sueo convulsiones sangrado o moretones inusuales Efectos secundarios que generalmente no requieren atencin mdica (infrmelos a su mdico o a Producer, television/film/video de la salud si persisten o si son molestos): cambios en el deseo o desempeo sexual cambios en el apetito o el peso estreimiento dolor de cabeza dolores musculares nuseas Puede ser que esta lista no menciona todos los posibles efectos secundarios. Comunquese a su mdico por asesoramiento mdico [employer]. Usted puede informar los efectos secundarios a la FDA por telfono al 1-800-FDA-1088. Dnde debo guardar mi medicina? Mantngala fuera del alcance de los nios. Gurdela a [employer], entre 15 y 30 grados C (61 y 63 grados F). Protjala de la luz. Mantenga el envase bien cerrado. Deseche los  medicamentos que no haya utilizado, despus de la fecha de vencimiento. ATENCIN: Este folleto es un resumen. Puede ser que no cubra toda la posible informacin. Si usted tiene preguntas acerca de esta medicina, consulte con su mdico, su farmacutico o su profesional de Radiographer, therapeutic.  2018 Elsevier/Gold Standard (2016-09-13 00:00:00)  IF you received an x-ray today, you will receive an invoice from [facility]. Please contact [facility] at [phone] with questions or concerns regarding your invoice.   IF you received labwork today, you will receive an invoice from [facility]. Please contact [facility] at [phone] with questions or concerns regarding your invoice.   Our billing staff will not be able to assist you with questions regarding bills from these companies.  You will be contacted with the lab results as soon as they are available. The fastest way to get your results is to activate your My Chart account. Instructions are located on the last page of this paperwork. If you have not heard from us regarding the results in 2 weeks, please contact this office.      

## 2017-05-15 NOTE — Progress Notes (Signed)
MRN: [id number] DOB: 07/26/1986  Subjective:   Teresa Hurst is a 31 y.o. female presenting for chief complaint of Follow-up  Anxiety - Patient was seen by Dr. Neva Seat on 12/28/2016, wanted to follow up regarding her results for her work up of near syncope. Denies recurrence of this episode. She has not been taking Prozac consistently because she did not understand she is supposed to take this daily. Admits ongoing anxiety, worries very easily, feels overwhelmed and stressed out with her life situation. She moved here from [place] since her husband got relocated for his job. She has a 72 y/o child but lacks familial support. She would also like to review her left lower leg pain. Denies redness, trauma, fever, calf pain. X-rays from her last OV was negative. Lastly, patient is worried that she feels dry mouth, has dark, malodorous urine despite drinking ~2 liters of water daily. Denies dysuria and admits hematuria but she started her cycle ~2 days ago. Drinks between 1 glass of wine to a full bottle 2-3 times weekly. Denies smoking cigarettes.  Jaleigha has a current medication list which includes the following prescription(s): cyclobenzaprine, diphenhydramine, fluoxetine, hydroxyzine, naproxen sodium, and ranitidine. Also is allergic to toradol [ketorolac tromethamine].  Charlena  has a past medical history of Allergy; Colon polyp; Gastric polyp; Gastritis; GERD (gastroesophageal reflux disease); History of colon polyps; Hypoglycemia; and Migraine headache. Also  has a past surgical history that includes colon polyps (removed) and Colonoscopy.  Objective:   Vitals: BP 117/72   Pulse 75   Temp 98.4 F (36.9 C) (Oral)   Resp 17   Ht  (1.651 m)   Wt 138 lb (62.6 kg)   LMP 04/16/2017 (Approximate)   SpO2 98%   BMI 22.96 kg/m   BP Readings from Last 3 Encounters:  05/15/17 117/72  05/07/17 (!) 132/91  12/28/16 111/74    Physical Exam  Constitutional: She is oriented to person,  place, and time. She appears well-developed and well-nourished.  HENT:  Lips are dry.  Eyes: No scleral icterus.  Neck: Normal range of motion. Neck supple. No thyromegaly present.  Cardiovascular: Normal rate, regular rhythm and intact distal pulses.  Exam reveals no gallop and no friction rub.   No murmur heard. Pulmonary/Chest: No respiratory distress. She has no wheezes. She has no rales.  Abdominal: Soft. Bowel sounds are normal. She exhibits no distension and no mass. There is tenderness (pelvic). There is no guarding.  Musculoskeletal:       Left lower leg: She exhibits tenderness (over area depicted; mildly positive Homann sign). She exhibits no bony tenderness, no swelling, no edema, no deformity and no laceration.       Legs: Neurological: She is alert and oriented to person, place, and time.   Results for orders placed or performed in visit on 05/15/17 (from the past 24 hour(s))  POCT urinalysis dipstick     Status: Abnormal   Collection Time: 05/15/17  4:31 PM  Result Value Ref Range   Color, UA yellow yellow   Clarity, UA cloudy (A) clear   Glucose, UA negative negative mg/dL   Bilirubin, UA negative negative   Ketones, POC UA negative negative mg/dL   Spec Grav, UA 4.098 1.191 - 1.025   Blood, UA large (A) negative   pH, UA 6.0 5.0 - 8.0   Protein Ur, POC negative negative mg/dL   Urobilinogen, UA 0.2 0.2 or 1.0 E.U./dL   Nitrite, UA Negative Negative   Leukocytes, UA Negative Negative  Assessment and Plan :   1. Anxiety 2. Near syncope 3. Insomnia, unspecified type - Counseled on diagnosis of Prozac. Will start trial of trazodone for insomnia. Counseled on use of Vistaril. Labs pending.  4. Pain in left lower leg - Physical exam findings reassuring. U/S pending.  5. Leukocytosis, unspecified type 6. Pelvic pain in female - Urine culture pending. Return-to-clinic precautions discussed, patient verbalized understanding.   Wallis Bamberg, PA-C Primary Care at  [facility] [phone] 05/15/2017  3:47 PM

## 2017-05-16 ENCOUNTER — Encounter: Payer: Self-pay | Admitting: Urgent Care

## 2017-05-16 LAB — COMPREHENSIVE METABOLIC PANEL
ALBUMIN: 4.7 g/dL (ref 3.5–5.5)
ALT: 13 IU/L (ref 0–32)
AST: 20 IU/L (ref 0–40)
Albumin/Globulin Ratio: 1.7 (ref 1.2–2.2)
Alkaline Phosphatase: 53 IU/L (ref 39–117)
BILIRUBIN TOTAL: 0.3 mg/dL (ref 0.0–1.2)
BUN/Creatinine Ratio: 12 (ref 9–23)
BUN: 8 mg/dL (ref 6–20)
CALCIUM: 9.1 mg/dL (ref 8.7–10.2)
CHLORIDE: 103 mmol/L (ref 96–106)
CO2: 23 mmol/L (ref 20–29)
Creatinine, Ser: 0.67 mg/dL (ref 0.57–1.00)
GFR calc non Af Amer: 119 mL/min/{1.73_m2} (ref >59)
GFR, EST AFRICAN AMERICAN: 137 mL/min/{1.73_m2} (ref >59)
GLUCOSE: 85 mg/dL (ref 65–99)
Globulin, Total: 2.7 g/dL (ref 1.5–4.5)
Potassium: 3.9 mmol/L (ref 3.5–5.2)
Sodium: 140 mmol/L (ref 134–144)
TOTAL PROTEIN: 7.4 g/dL (ref 6.0–8.5)

## 2017-05-16 LAB — CBC WITH DIFFERENTIAL/PLATELET
BASOS ABS: 0 10*3/uL (ref 0.0–0.2)
Basos: 0 %
EOS (ABSOLUTE): 0.1 10*3/uL (ref 0.0–0.4)
Eos: 1 %
HEMOGLOBIN: 13.2 g/dL (ref 11.1–15.9)
Hematocrit: 39.9 % (ref 34.0–46.6)
IMMATURE GRANS (ABS): 0 10*3/uL (ref 0.0–0.1)
Immature Granulocytes: 0 %
LYMPHS: 23 %
Lymphocytes Absolute: 2 10*3/uL (ref 0.7–3.1)
MCH: 31.1 pg (ref 26.6–33.0)
MCHC: 33.1 g/dL (ref 31.5–35.7)
MCV: 94 fL (ref 79–97)
MONOCYTES: 7 %
Monocytes Absolute: 0.6 10*3/uL (ref 0.1–0.9)
NEUTROS PCT: 69 %
Neutrophils Absolute: 5.9 10*3/uL (ref 1.4–7.0)
PLATELETS: 268 10*3/uL (ref 150–379)
RBC: 4.24 x10E6/uL (ref 3.77–5.28)
RDW: 12.9 % (ref 12.3–15.4)
WBC: 8.5 10*3/uL (ref 3.4–10.8)

## 2017-05-16 LAB — URINE CULTURE

## 2017-05-16 LAB — TSH: TSH: 1.63 u[IU]/mL (ref 0.450–4.500)

## 2017-05-17 ENCOUNTER — Telehealth: Payer: Self-pay | Admitting: Family Medicine

## 2017-05-17 NOTE — Telephone Encounter (Signed)
Pt calling stating that they had received a call from [place] and know one left a message

## 2017-05-20 NOTE — Telephone Encounter (Signed)
I dont see anything but lab results that someone might have called for.

## 2017-11-17 IMAGING — DX DG CHEST 2V
2 series · 2 of 2 positions shown · non-contrast
Comparison: 12/07/2016

CLINICAL DATA: Chest pain

EXAM:
CHEST  2 VIEW

[chest pa]
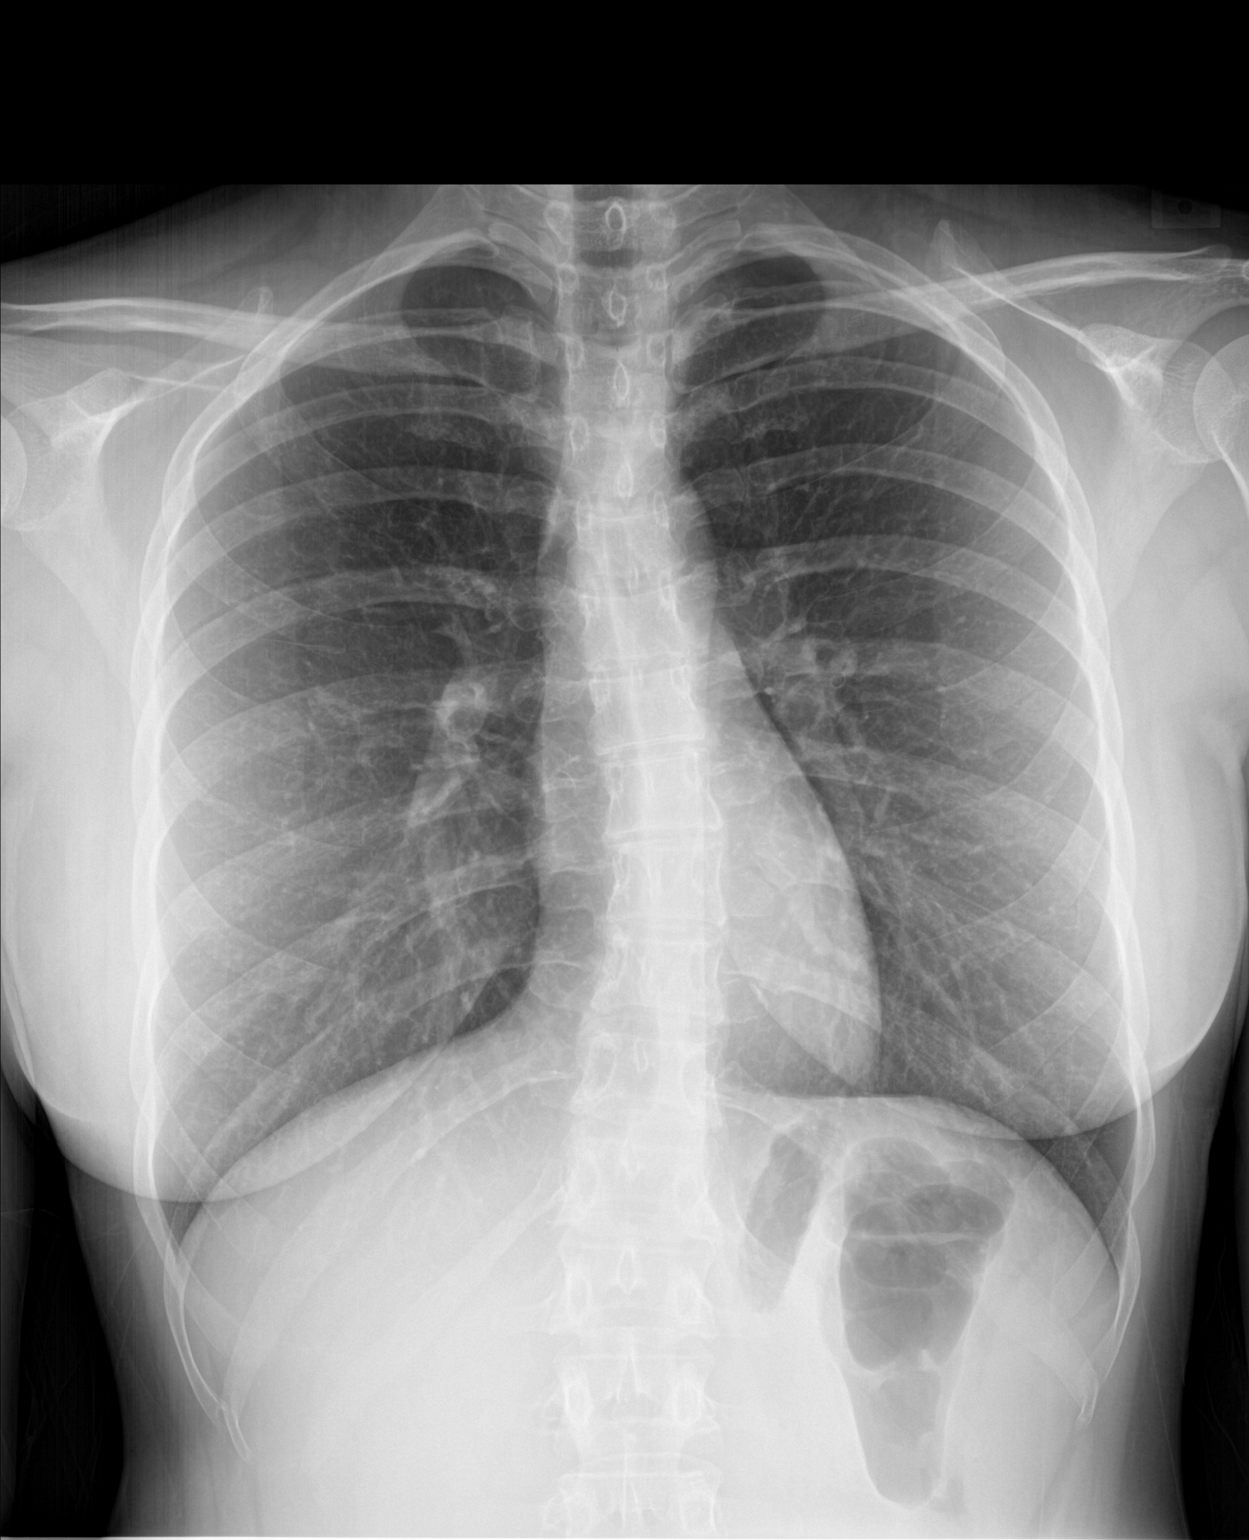

[chest lat]
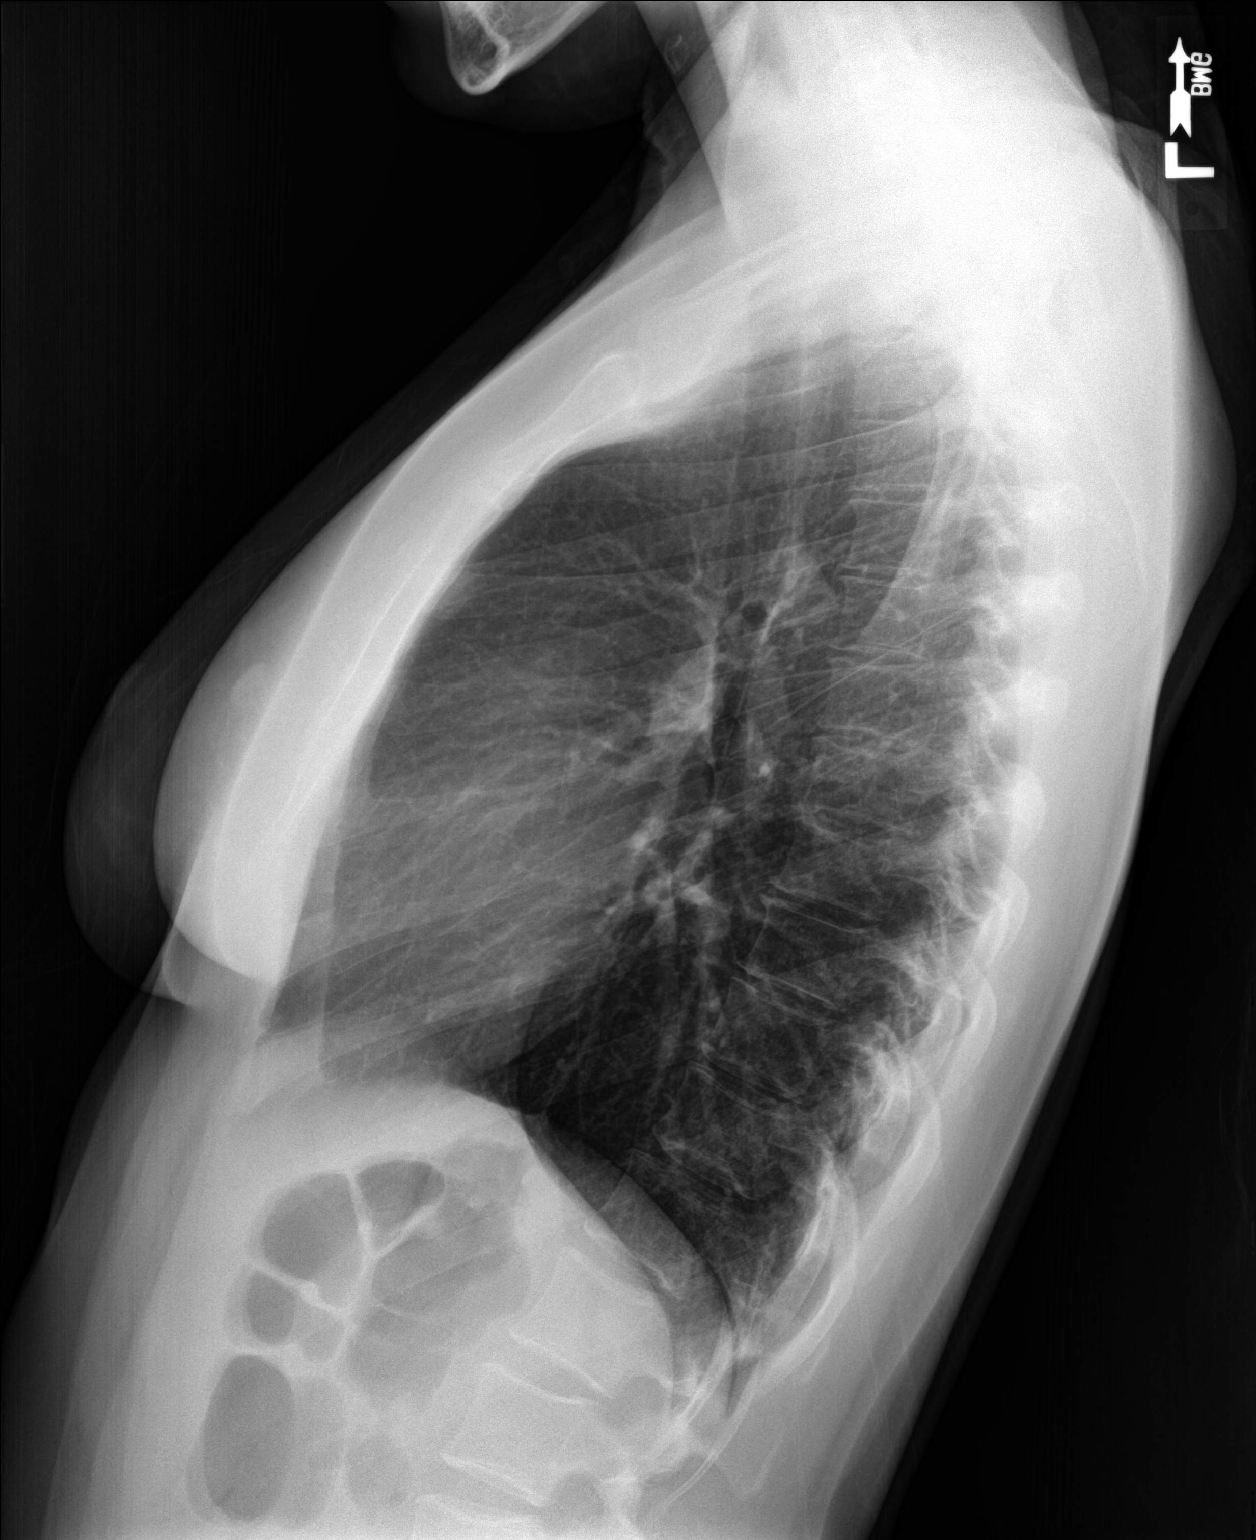

[2 of 2 positions shown; findings below may reference images not displayed]

FINDINGS: Cardiac shadow is within normal limits. The lungs are well aerated
bilaterally. No focal infiltrate or sizable effusion is seen. No
acute bony abnormality is noted.
IMPRESSION: No active cardiopulmonary disease.
